# Patient Record
Sex: Female | Born: 1965
Health system: Southern US, Community
[De-identification: ages and names within clinical notes are randomized; demographics above are authoritative.]

## PROBLEM LIST (undated history)

## (undated) DIAGNOSIS — I10 Essential (primary) hypertension: Secondary | ICD-10-CM

## (undated) DIAGNOSIS — F32A Depression, unspecified: Secondary | ICD-10-CM

## (undated) DIAGNOSIS — N950 Postmenopausal bleeding: Secondary | ICD-10-CM

## (undated) DIAGNOSIS — E7800 Pure hypercholesterolemia, unspecified: Secondary | ICD-10-CM

## (undated) DIAGNOSIS — F419 Anxiety disorder, unspecified: Secondary | ICD-10-CM

## (undated) DIAGNOSIS — N840 Polyp of corpus uteri: Secondary | ICD-10-CM

## (undated) DIAGNOSIS — R011 Cardiac murmur, unspecified: Secondary | ICD-10-CM

## (undated) DIAGNOSIS — D259 Leiomyoma of uterus, unspecified: Secondary | ICD-10-CM

## (undated) DIAGNOSIS — E559 Vitamin D deficiency, unspecified: Secondary | ICD-10-CM

## (undated) DIAGNOSIS — K219 Gastro-esophageal reflux disease without esophagitis: Secondary | ICD-10-CM

## (undated) DIAGNOSIS — Z8489 Family history of other specified conditions: Secondary | ICD-10-CM

## (undated) DIAGNOSIS — E039 Hypothyroidism, unspecified: Secondary | ICD-10-CM

## (undated) DIAGNOSIS — E079 Disorder of thyroid, unspecified: Secondary | ICD-10-CM

## (undated) DIAGNOSIS — R9389 Abnormal findings on diagnostic imaging of other specified body structures: Secondary | ICD-10-CM

## (undated) DIAGNOSIS — I059 Rheumatic mitral valve disease, unspecified: Secondary | ICD-10-CM

## (undated) HISTORY — DX: Essential (primary) hypertension: I10

## (undated) HISTORY — DX: Disorder of thyroid, unspecified: E07.9

## (undated) HISTORY — DX: Rheumatic mitral valve disease, unspecified: I05.9

## (undated) HISTORY — DX: Gastro-esophageal reflux disease without esophagitis: K21.9

## (undated) HISTORY — DX: Morbid (severe) obesity due to excess calories: E66.01

## (undated) HISTORY — PX: COLONOSCOPY: SHX174

## (undated) HISTORY — DX: Cardiac murmur, unspecified: R01.1

## (undated) HISTORY — PX: WISDOM TOOTH EXTRACTION: SHX21

## (undated) HISTORY — DX: Pure hypercholesterolemia, unspecified: E78.00

## (undated) HISTORY — PX: ABDOMINAL HYSTERECTOMY: SHX81

---

## 2005-01-17 HISTORY — PX: DILATION AND CURETTAGE OF UTERUS: SHX78

## 2005-03-17 ENCOUNTER — Ambulatory Visit: Payer: Self-pay | Admitting: Unknown Physician Specialty

## 2007-07-26 ENCOUNTER — Ambulatory Visit: Payer: Self-pay | Admitting: General Surgery

## 2008-03-29 ENCOUNTER — Ambulatory Visit: Payer: Self-pay | Admitting: Family Medicine

## 2008-12-01 ENCOUNTER — Ambulatory Visit: Payer: Self-pay | Admitting: Internal Medicine

## 2010-04-12 ENCOUNTER — Ambulatory Visit: Payer: Self-pay | Admitting: Internal Medicine

## 2010-12-21 ENCOUNTER — Ambulatory Visit: Payer: Self-pay | Admitting: Internal Medicine

## 2012-05-07 ENCOUNTER — Encounter: Payer: Self-pay | Admitting: *Deleted

## 2012-05-29 ENCOUNTER — Other Ambulatory Visit: Payer: Self-pay | Admitting: General Surgery

## 2012-05-29 ENCOUNTER — Encounter: Payer: Self-pay | Admitting: General Surgery

## 2012-05-29 ENCOUNTER — Ambulatory Visit (INDEPENDENT_AMBULATORY_CARE_PROVIDER_SITE_OTHER): Payer: 59 | Admitting: General Surgery

## 2012-05-29 VITALS — BP 128/82 | HR 82 | Resp 12 | Ht 65.0 in | Wt 255.0 lb

## 2012-05-29 DIAGNOSIS — Z800 Family history of malignant neoplasm of digestive organs: Secondary | ICD-10-CM

## 2012-05-29 DIAGNOSIS — D126 Benign neoplasm of colon, unspecified: Secondary | ICD-10-CM

## 2012-05-29 MED ORDER — POLYETHYLENE GLYCOL 3350 17 GM/SCOOP PO POWD: ORAL | Status: DC

## 2012-05-29 NOTE — Progress Notes (Signed)
aPatient ID: Sarah Sharp, female   DOB: 03/12/1965, 47 y.o.   MRN: [id number]  Chief Complaint  Patient presents with  . Colonoscopy    HPI Sarah Sharp is a 47 y.o. female who presents for a screening colonoscopy. The patient's last colonoscopy was done in 2009. She has a family history of colon cancer in multiple first degree relatives. She states she is doing well and has no problems with her bowels at this time. All prior colonoscopies have been normal. She has an older sister this year who had a colonoscopy which showed a precancerous polyp. No complaints at this time.  The patient show a 2009 colonoscopy showed moderate diverticulosis in the sigmoid colon. HPI  Past Medical History  Diagnosis Date  . Heart murmur   . Thyroid disease     Past Surgical History  Procedure Laterality Date  . Dilation and curettage of uterus  2007  . Colonoscopy  2002,2009    hx of colon cancer in multiple 1st degree relatives. Findings, multiple small & large mouthed diverticula in sigmoid colon    Family History  Problem Relation Age of Onset  . Cancer Father     colon cancer  . Colon polyps Sister   . Cancer Paternal Grandfather     colon cancer  . Hypertension Mother     Social History History  Substance Use Topics  . Smoking status: Never Smoker   . Smokeless tobacco: Never Used  . Alcohol Use: Yes    No Known Allergies  Current Outpatient Prescriptions  Medication Sig Dispense Refill  . Cholecalciferol (SM VITAMIN D3) 4000 UNITS CAPS Take 2 capsules by mouth daily.      . Cyanocobalamin (VITAMIN B-12) 2000 MCG TBCR Take 1 tablet by mouth 3 (three) times a week.      . fexofenadine (ALLEGRA) 180 MG tablet Take 180 mg by mouth as needed.      Marland Kitchen ibuprofen (ADVIL,MOTRIN) 200 MG tablet Take 200 mg by mouth every 6 (six) hours as needed for pain.      . nortriptyline (PAMELOR) 10 MG capsule Take 10 mg by mouth at bedtime. 2 tabs at [facility]      . polyethylene glycol powder  (GLYCOLAX/MIRALAX) powder 255 grams one bottle for colonoscopy prep  255 g  0  . PROGESTERONE, VAGINAL, 8 % GEL Place vaginally as directed.      . thyroid (ARMOUR) 30 MG tablet Take 30 mg by mouth daily. 5 tabs daily      . vitamin B-12 (CYANOCOBALAMIN) 1000 MCG tablet Take 1,000 mcg by mouth daily.       No current facility-administered medications for this visit.    Review of Systems Review of Systems  Constitutional: Negative.   Respiratory: Negative.   Cardiovascular: Negative.   Gastrointestinal: Negative.     Blood pressure 128/82, pulse 82, resp. rate 12, height 5\' 5"  (1.651 m), weight 255 lb (115.667 kg), last menstrual period 05/21/2012.  Physical Exam Physical Exam  Constitutional: She appears well-developed and well-nourished.  Neck: Trachea normal. No mass and no thyromegaly present.  Cardiovascular: Normal rate, regular rhythm, normal heart sounds and normal pulses.   No murmur heard. Pulmonary/Chest: Effort normal and breath sounds normal.    Data Reviewed None  Assessment    Family history of colon cancer.     Plan    The pros and cons of screening had been reviewed. The risks associated with the procedure including those related to bleeding and perforation of  been discussed.      This patient has been scheduled for a colonoscopy on 06-05-12 at [facility].   Earline Mayotte 05/29/2012, 10:15 PM   a

## 2012-05-29 NOTE — Patient Instructions (Addendum)
Patient to have screening colonoscopy scheduled.   This patient has been scheduled for a colonoscopy on 06-05-12 at [facility].

## 2012-06-04 ENCOUNTER — Telehealth: Payer: Self-pay | Admitting: *Deleted

## 2012-06-04 NOTE — Telephone Encounter (Signed)
Patient was contacted today to reschedule colonoscopy that was scheduled for 06-05-12 due to Dr. Rutherford Nail request. This patient has been rescheduled to 07-03-12. She is aware of all instructions and verbalizes understanding.   Trish in endoscopy notified.

## 2012-06-27 ENCOUNTER — Telehealth: Payer: Self-pay | Admitting: *Deleted

## 2012-06-27 NOTE — Telephone Encounter (Signed)
Patient was contacted today to verify no medication changes since last office visit and she confirms. This patient states she has occasionally taken Emergen-C in the past few weeks and was told this was okay. She reports she has already pre-registered for colonoscopy. We will proceed with colonoscopy that is scheduled for 07-03-12 at [facility]. Patient instructed to call if she has further questions.

## 2012-07-03 ENCOUNTER — Ambulatory Visit: Payer: Self-pay | Admitting: General Surgery

## 2012-07-03 DIAGNOSIS — D128 Benign neoplasm of rectum: Secondary | ICD-10-CM

## 2012-07-03 DIAGNOSIS — D129 Benign neoplasm of anus and anal canal: Secondary | ICD-10-CM

## 2012-07-04 ENCOUNTER — Encounter: Payer: Self-pay | Admitting: General Surgery

## 2012-07-06 ENCOUNTER — Telehealth: Payer: Self-pay | Admitting: General Surgery

## 2012-07-06 NOTE — Telephone Encounter (Signed)
The patient was notified that the polyp removed from the rectum at the time of her 07/04/2012 colonoscopy was benign. (Hyperplastic polyp).  Because of her family history of colon cancer first degree relative she will be encouraged to have a follow up exam in 5 years.

## 2012-07-10 ENCOUNTER — Encounter: Payer: Self-pay | Admitting: General Surgery

## 2013-11-18 ENCOUNTER — Encounter: Payer: Self-pay | Admitting: General Surgery

## 2015-02-09 ENCOUNTER — Ambulatory Visit
Admission: EM | Admit: 2015-02-09 | Discharge: 2015-02-09 | Disposition: A | Discharge status: Home / Self Care | Payer: 59 | Attending: Family Medicine | Admitting: Family Medicine

## 2015-02-09 ENCOUNTER — Encounter: Payer: Self-pay | Admitting: *Deleted

## 2015-02-09 DIAGNOSIS — J069 Acute upper respiratory infection, unspecified: Secondary | ICD-10-CM

## 2015-02-09 HISTORY — DX: Anxiety disorder, unspecified: F41.9

## 2015-02-09 MED ORDER — FLUTICASONE PROPIONATE 50 MCG/ACT NA SUSP: 2.0000 | Freq: Every day | NASAL | Status: DC

## 2015-02-09 MED ORDER — GUAIFENESIN-CODEINE 100-10 MG/5ML PO SOLN: 5.0000 mL | Freq: Three times a day (TID) | ORAL | Status: DC | PRN

## 2015-02-09 NOTE — ED Notes (Signed)
Patient started having symptoms of nasal congestion this past Saturday night. No other symptoms reported.

## 2015-02-09 NOTE — Discharge Instructions (Signed)
Take medication as prescribed. Rest.  Follow-up closely with her primary care physician this week as needed. Return to urgent care as needed for new or worsening concerns.  Upper Respiratory Infection, Adult Most upper respiratory infections (URIs) are a viral infection of the air passages leading to the lungs. A URI affects the nose, throat, and upper air passages. The most common type of URI is nasopharyngitis and is typically referred to as "the common cold." URIs run their course and usually go away on their own. Most of the time, a URI does not require medical attention, but sometimes a bacterial infection in the upper airways can follow a viral infection. This is called a secondary infection. Sinus and middle ear infections are common types of secondary upper respiratory infections. Bacterial pneumonia can also complicate a URI. A URI can worsen asthma and chronic obstructive pulmonary disease (COPD). Sometimes, these complications can require emergency medical care and may be life threatening.  CAUSES Almost all URIs are caused by viruses. A virus is a type of germ and can spread from one person to another.  RISKS FACTORS You may be at risk for a URI if:   You smoke.   You have chronic heart or lung disease.  You have a weakened defense (immune) system.   You are very young or very old.   You have nasal allergies or asthma.  You work in crowded or poorly ventilated areas.  You work in health care facilities or schools. SIGNS AND SYMPTOMS  Symptoms typically develop 2-3 days after you come in contact with a cold virus. Most viral URIs last 7-10 days. However, viral URIs from the influenza virus (flu virus) can last 14-18 days and are typically more severe. Symptoms may include:   Runny or stuffy (congested) nose.   Sneezing.   Cough.   Sore throat.   Headache.   Fatigue.   Fever.   Loss of appetite.   Pain in your forehead, behind your eyes, and over your  cheekbones (sinus pain).  Muscle aches.  DIAGNOSIS  Your health care provider may diagnose a URI by:  Physical exam.  Tests to check that your symptoms are not due to another condition such as:  Strep throat.  Sinusitis.  Pneumonia.  Asthma. TREATMENT  A URI goes away on its own with time. It cannot be cured with medicines, but medicines may be prescribed or recommended to relieve symptoms. Medicines may help:  Reduce your fever.  Reduce your cough.  Relieve nasal congestion. HOME CARE INSTRUCTIONS   Take medicines only as directed by your health care provider.   Gargle warm saltwater or take cough drops to comfort your throat as directed by your health care provider.  Use a warm mist humidifier or inhale steam from a shower to increase air moisture. This may make it easier to breathe.  Drink enough fluid to keep your urine clear or pale yellow.   Eat soups and other clear broths and maintain good nutrition.   Rest as needed.   Return to work when your temperature has returned to normal or as your health care provider advises. You may need to stay home longer to avoid infecting others. You can also use a face mask and careful hand washing to prevent spread of the virus.  Increase the usage of your inhaler if you have asthma.   Do not use any tobacco products, including cigarettes, chewing tobacco, or electronic cigarettes. If you need help quitting, ask your health care  provider. PREVENTION  The best way to protect yourself from getting a cold is to practice good hygiene.   Avoid oral or hand contact with people with cold symptoms.   Wash your hands often if contact occurs.  There is no clear evidence that vitamin C, vitamin E, echinacea, or exercise reduces the chance of developing a cold. However, it is always recommended to get plenty of rest, exercise, and practice good nutrition.  SEEK MEDICAL CARE IF:   You are getting worse rather than better.    Your symptoms are not controlled by medicine.   You have chills.  You have worsening shortness of breath.  You have brown or red mucus.  You have yellow or brown nasal discharge.  You have pain in your face, especially when you bend forward.  You have a fever.  You have swollen neck glands.  You have pain while swallowing.  You have white areas in the back of your throat. SEEK IMMEDIATE MEDICAL CARE IF:   You have severe or persistent:  Headache.  Ear pain.  Sinus pain.  Chest pain.  You have chronic lung disease and any of the following:  Wheezing.  Prolonged cough.  Coughing up blood.  A change in your usual mucus.  You have a stiff neck.  You have changes in your:  Vision.  Hearing.  Thinking.  Mood. MAKE SURE YOU:   Understand these instructions.  Will watch your condition.  Will get help right away if you are not doing well or get worse.   This information is not intended to replace advice given to you by your health care provider. Make sure you discuss any questions you have with your health care provider.   Document Released: 06/29/2000 Document Revised: 05/20/2014 Document Reviewed: 04/10/2013 Elsevier Interactive Patient Education [employer].

## 2015-02-09 NOTE — ED Provider Notes (Signed)
[facility]  ____________________________________________  Time seen: Approximately 3:42 PM  I have reviewed the triage vital signs and the nursing notes.   HISTORY  Chief Complaint Nasal Congestion   HPI Sarah Sharp is a 50 y.o. female    Presents for the complaints of 2 days of runny nose, nasal congestion and sinus pressure. Reports occasional cough. Reports clear drainage. Denies fevers. Denies chest pain, shortness of breath, abdominal pain, dizziness or weakness. Denies known sick contacts. Reports continues to eat and drink well. Reports over-the-counter cough and congestion medications  did not seem to be helping. Reports history of seasonal allergies.  Last menstrual : Current. Denies chance of pregnancy.   PCP: [facility]   Past Medical History  Diagnosis Date  . Heart murmur   . Thyroid disease   . Anxiety     Patient Active Problem List   Diagnosis Date Noted  . Family history of colon cancer 05/29/2012    Past Surgical History  Procedure Laterality Date  . Dilation and curettage of uterus  2007  . Colonoscopy  2002,2009    hx of colon cancer in multiple 1st degree relatives. Findings, multiple small & large mouthed diverticula in sigmoid colon    Current Outpatient Rx  Name  Route  Sig  Dispense  Refill  . Cholecalciferol (SM VITAMIN D3) 4000 UNITS CAPS   Oral   Take 2 capsules by mouth daily.         . fexofenadine (ALLEGRA) 180 MG tablet   Oral   Take 180 mg by mouth as needed.         Marland Kitchen ibuprofen (ADVIL,MOTRIN) 200 MG tablet   Oral   Take 200 mg by mouth every 6 (six) hours as needed for pain.         Marland Kitchen levothyroxine (SYNTHROID, LEVOTHROID) 150 MCG tablet   Oral   Take 150 mcg by mouth daily before breakfast.         . nortriptyline (PAMELOR) 10 MG capsule   Oral   Take 20 mg by mouth at bedtime.         Marland Kitchen PROGESTERONE, VAGINAL, 8 % GEL   Vaginal   Place vaginally as directed.         . vitamin B-12 (CYANOCOBALAMIN)  1000 MCG tablet   Oral   Take 1,000 mcg by mouth daily.         . Cyanocobalamin (VITAMIN B-12) 2000 MCG TBCR   Oral   Take 1 tablet by mouth 3 (three) times a week.         . nortriptyline (PAMELOR) 10 MG capsule   Oral   Take 10 mg by mouth at bedtime. 2 tabs at [facility]         . polyethylene glycol powder (GLYCOLAX/MIRALAX) powder      255 grams one bottle for colonoscopy prep   255 g   0   . thyroid (ARMOUR) 30 MG tablet   Oral   Take 30 mg by mouth daily. 5 tabs daily           Allergies Review of patient's allergies indicates no known allergies.  Family History  Problem Relation Age of Onset  . Cancer Father     colon cancer  . Colon polyps Sister   . Cancer Paternal Grandfather     colon cancer  . Hypertension Mother     Social History Social History  Substance Use Topics  . Smoking status: Never Smoker   .  Smokeless tobacco: Never Used  . Alcohol Use: No    Review of Systems Constitutional: No fever/chills Eyes: No visual changes. ENT: No sore throat. Positive runny nose, nasal congestion, and sinus pressure. Cardiovascular: Denies chest pain. Respiratory: Denies shortness of breath. Gastrointestinal: No abdominal pain.  No nausea, no vomiting.  No diarrhea.  No constipation. Genitourinary: Negative for dysuria. Musculoskeletal: Negative for back pain. Skin: Negative for rash. Neurological: Negative for headaches, focal weakness or numbness.  10-point ROS otherwise negative.  ____________________________________________   PHYSICAL EXAM:  VITAL SIGNS: ED Triage Vitals  Enc Vitals Group     BP 02/09/15 1508 156/93 mmHg     Pulse Rate 02/09/15 1454 105 Recheck 92     Resp 02/09/15 1454 18     Temp 02/09/15 1454 98.4 F (36.9 C)     Temp Source 02/09/15 1454 Oral     SpO2 02/09/15 1454 100 %     Weight 02/09/15 1454 270 lb (122.471 kg)     Height 02/09/15 1454 5\' 5"  (1.651 m)     Head Cir --      Peak Flow --      Pain Score  02/09/15 1504 0     Pain Loc --      Pain Edu? --      Excl. in [place]? --     Constitutional: Alert and oriented. Well appearing and in no acute distress. Eyes: Conjunctivae are normal. PERRL. EOMI. Head: [email or web address] tenderness to palpation right maxillary sinus. No other sinus tenderness to palpation. No swelling. No erythema.   Ears: no erythema, normal TMs bilaterally.   Nose: nasal congestion with bilateral nasal turbinate erythema. Clear rhinorrhea. Throat: Mucous membranes are moist.  Oropharynx non-erythematous. No tonsillar swelling or exudate.  Neck: No stridor.  No cervical spine tenderness to palpation. Hematological/Lymphatic/Immunilogical: No cervical lymphadenopathy. Cardiovascular: Normal rate, regular rhythm. Grossly normal heart sounds.  Good peripheral circulation. Respiratory: Normal respiratory effort.  No retractions. Lungs CTAB. No wheezes, rales or rhonchi. Good air movement.  Gastrointestinal: Soft and nontender.Normal Bowel sounds. No CVA tenderness. Musculoskeletal: No lower or upper extremity tenderness nor edema.  No joint effusions. Bilateral pedal pulses equal and easily palpated.  Neurologic:  Normal speech and language. No gross focal neurologic deficits are appreciated. No gait instability. Skin:  Skin is warm, dry and intact. No rash noted. Psychiatric: Mood and affect are normal. Speech and behavior are normal.  ____________________________________________   LABS (all labs ordered are listed, but only abnormal results are displayed)  Labs Reviewed - No data to display   INITIAL IMPRESSION / ASSESSMENT AND PLAN / ED COURSE  Pertinent labs & imaging results that were available during my care of the patient were reviewed by me and considered in my medical decision making (see chart for details).  very well-appearing patient. No acute distress. Presents with complaints of 2 days of runny nose, nasal congestion, sinus drainage and sinus pressure.  Reports occasional cough. Denies fevers. Denies chest pain, shortness of breath, purulent drainage, weakness or decreased by mouth intake. Lungs clear throughout. Abdomen soft and nontender. Suspect viral upper respiratory infection. Will treat symptomatically and supportively. Will treat with when necessary guaifenesin with codeine, Flonase, over-the-counter Claritin-D. Encouraged rest, fluids, saline nasal rinses or Nettie pot use. Work note given for today.  cussed follow up with Primary care physician this week. Discussed follow up and return parameters including no resolution or any worsening concerns. Patient verbalized understanding and agreed to plan.   ____________________________________________  FINAL CLINICAL IMPRESSION(S) / ED DIAGNOSES  Final diagnoses:  Upper respiratory infection      Note: This dictation was prepared with Dragon dictation along with smaller phrase technology. Any transcriptional errors that result from this process are unintentional.    Marylene Land, NP 02/09/15 1644

## 2015-12-01 ENCOUNTER — Other Ambulatory Visit: Payer: Self-pay | Admitting: Certified Nurse Midwife

## 2015-12-01 DIAGNOSIS — Z1231 Encounter for screening mammogram for malignant neoplasm of breast: Secondary | ICD-10-CM

## 2015-12-03 ENCOUNTER — Ambulatory Visit
Admission: RE | Admit: 2015-12-03 | Discharge: 2015-12-03 | Disposition: A | Discharge status: Home / Self Care | Payer: 59 | Source: Ambulatory Visit | Attending: Certified Nurse Midwife | Admitting: Certified Nurse Midwife

## 2015-12-03 DIAGNOSIS — Z1231 Encounter for screening mammogram for malignant neoplasm of breast: Secondary | ICD-10-CM | POA: Diagnosis not present

## 2016-02-29 DIAGNOSIS — E039 Hypothyroidism, unspecified: Secondary | ICD-10-CM | POA: Diagnosis not present

## 2016-02-29 DIAGNOSIS — E559 Vitamin D deficiency, unspecified: Secondary | ICD-10-CM | POA: Diagnosis not present

## 2016-03-07 DIAGNOSIS — E039 Hypothyroidism, unspecified: Secondary | ICD-10-CM | POA: Diagnosis not present

## 2016-03-07 DIAGNOSIS — I10 Essential (primary) hypertension: Secondary | ICD-10-CM | POA: Diagnosis not present

## 2016-04-27 DIAGNOSIS — I10 Essential (primary) hypertension: Secondary | ICD-10-CM | POA: Diagnosis not present

## 2016-08-10 DIAGNOSIS — E039 Hypothyroidism, unspecified: Secondary | ICD-10-CM | POA: Diagnosis not present

## 2016-08-10 DIAGNOSIS — I10 Essential (primary) hypertension: Secondary | ICD-10-CM | POA: Diagnosis not present

## 2016-10-28 DIAGNOSIS — I10 Essential (primary) hypertension: Secondary | ICD-10-CM | POA: Diagnosis not present

## 2016-11-03 DIAGNOSIS — I10 Essential (primary) hypertension: Secondary | ICD-10-CM | POA: Diagnosis not present

## 2016-11-03 DIAGNOSIS — E039 Hypothyroidism, unspecified: Secondary | ICD-10-CM | POA: Diagnosis not present

## 2016-12-05 ENCOUNTER — Other Ambulatory Visit: Payer: Self-pay | Admitting: Certified Nurse Midwife

## 2016-12-05 ENCOUNTER — Ambulatory Visit (INDEPENDENT_AMBULATORY_CARE_PROVIDER_SITE_OTHER): Payer: 59 | Admitting: Certified Nurse Midwife

## 2016-12-05 ENCOUNTER — Encounter: Payer: Self-pay | Admitting: Certified Nurse Midwife

## 2016-12-05 VITALS — BP 142/78 | HR 83 | Ht 65.0 in | Wt 271.0 lb

## 2016-12-05 DIAGNOSIS — Z800 Family history of malignant neoplasm of digestive organs: Secondary | ICD-10-CM

## 2016-12-05 DIAGNOSIS — Z01419 Encounter for gynecological examination (general) (routine) without abnormal findings: Secondary | ICD-10-CM

## 2016-12-05 DIAGNOSIS — I10 Essential (primary) hypertension: Secondary | ICD-10-CM | POA: Insufficient documentation

## 2016-12-05 DIAGNOSIS — Z8041 Family history of malignant neoplasm of ovary: Secondary | ICD-10-CM

## 2016-12-05 DIAGNOSIS — F329 Major depressive disorder, single episode, unspecified: Secondary | ICD-10-CM | POA: Insufficient documentation

## 2016-12-05 DIAGNOSIS — Z1231 Encounter for screening mammogram for malignant neoplasm of breast: Secondary | ICD-10-CM | POA: Diagnosis not present

## 2016-12-05 DIAGNOSIS — Z124 Encounter for screening for malignant neoplasm of cervix: Secondary | ICD-10-CM

## 2016-12-05 DIAGNOSIS — F419 Anxiety disorder, unspecified: Secondary | ICD-10-CM | POA: Insufficient documentation

## 2016-12-05 DIAGNOSIS — E039 Hypothyroidism, unspecified: Secondary | ICD-10-CM | POA: Insufficient documentation

## 2016-12-05 DIAGNOSIS — F32A Depression, unspecified: Secondary | ICD-10-CM | POA: Insufficient documentation

## 2016-12-05 DIAGNOSIS — Z1239 Encounter for other screening for malignant neoplasm of breast: Secondary | ICD-10-CM

## 2016-12-05 MED ORDER — PROGESTERONE MICRONIZED 200 MG PO CAPS: ORAL_CAPSULE | ORAL | 4 refills | Status: DC

## 2016-12-05 NOTE — Progress Notes (Signed)
Gynecology Annual Exam  PCP: Glendon Axe, MD  Chief Complaint:  Chief Complaint  Patient presents with  . Gynecologic Exam    History of Present Illness:Sarah Sharp is a 51 year old Caucasian/White female , G 0 , who presents for her annual exam . She is having monthly bleeding since her last annual exam . She currently takes progesterone 200 mgm from days 14-25 each cycle. She did not take Prometrium in January and had a bleed x 11 days, although not heavy. Ultrasound 11/07/2014 revealed an EM stripe measuring 5.14 mm. There were two fibroids: a posterior intramural fibroid measuring 50x39x41 mm and another anterior fibroid measuring 14x8x11 mm. There was a small paraovarian cyst on the right and a simple cyst on the left ovary 40x36x66mm in size.  Her menses are usually regular They occur every month , they usually last 3-7 days , are medium flow with an occasional heavy flow day. Her LMP was 11/17/2016.  Denies hot flashes   She reports dysmenorrhea. She uses ibuprofen 600 mgm with symptomatic relief.  The patient's past medical history is notable for a history of hypothyroidism, anxiety, hyperlipidemia, hypertension, and obesity.  Since her last annual GYN exam dated 12/01/2015, she has been restarted on antihypertensive medication.  Her PCP is Glendon Axe  She is not sexually active.  Her most recent pap smear was obtained 11/05/2013 and was with negative cells and negative HPV DNA.  Her most recent mammogram obtained on 12/03/2015 was normal.  There is no family history of breast cancer.  There is a family history of ovarian cancer in her maternal grandmother. Genetic testing has not been done. Her father and paternal grandmother have colon cancer. Her last colonoscopy was in 2014. Next one is due 2019. The patient does not do monthly self breast exams.  The patient does not smoke.  The patient does drink rarely.  The patient does not use illegal drugs.  The patient  does not exercise.  The patient does get adequate calcium in her diet.  She had a recent cholesterol screen in 2018 by PCP that was normal except for mildly elevated  triglyceride level.    The patient denies current symptoms of depression.    Review of Systems: ROS  Past Medical History:  Past Medical History:  Diagnosis Date  . Anxiety   . Heart murmur   . Thyroid disease     Past Surgical History:  Past Surgical History:  Procedure Laterality Date  . COLONOSCOPY  2002,2009   hx of colon cancer in multiple 1st degree relatives. Findings, multiple small & large mouthed diverticula in sigmoid colon  . DILATION AND CURETTAGE OF UTERUS  2007    Family History:  Family History  Problem Relation Age of Onset  . Cancer Father        colon cancer  . Colon polyps Sister   . Cancer Paternal Grandfather        colon cancer  . Hypertension Mother   . Breast cancer Neg Hx     Social History:  Social History   Socioeconomic History  . Marital status: Single    Spouse name: Not on file  . Number of children: Not on file  . Years of education: Not on file  . Highest education level: Not on file  Social Needs  . Financial resource strain: Not on file  . Food insecurity - worry: Not on file  . Food insecurity - inability: Not on  file  . Transportation needs - medical: Not on file  . Transportation needs - non-medical: Not on file  Occupational History  . Not on file  Tobacco Use  . Smoking status: Never Smoker  . Smokeless tobacco: Never Used  Substance and Sexual Activity  . Alcohol use: No  . Drug use: No  . Sexual activity: Not on file  Other Topics Concern  . Not on file  Social History Narrative  . Not on file    Allergies:  No Known Allergies  Medications: Prior to Admission medications   Medication Sig Start Date End Date Taking? Authorizing Provider  Cholecalciferol (SM VITAMIN D3) 4000 UNITS CAPS Take 2 capsules by mouth daily.    [provider]  Cyanocobalamin (VITAMIN B-12) 2000 MCG TBCR Take 1 tablet by mouth 3 (three) times a week.    [provider]  fexofenadine (ALLEGRA) 180 MG tablet Take 180 mg by mouth as needed.    [provider]  fluticasone (FLONASE) 50 MCG/ACT nasal spray Place 2 sprays into both nostrils daily. 02/09/15 02/23/15  Marylene Land, NP  guaiFENesin-codeine 100-10 MG/5ML syrup Take 5 mLs by mouth 3 (three) times daily as needed for cough. 02/09/15   Marylene Land, NP  ibuprofen (ADVIL,MOTRIN) 200 MG tablet Take 200 mg by mouth every 6 (six) hours as needed for pain.    [provider]  levothyroxine (SYNTHROID, LEVOTHROID) 150 MCG tablet Take 150 mcg by mouth daily before breakfast.    [provider]  nortriptyline (PAMELOR) 10 MG capsule Take 10 mg by mouth at bedtime. 2 tabs at [facility] 03/05/12   [provider]  nortriptyline (PAMELOR) 10 MG capsule Take 20 mg by mouth at bedtime.    [provider]  polyethylene glycol powder (GLYCOLAX/MIRALAX) powder 255 grams one bottle for colonoscopy prep 05/29/12   Robert Bellow, MD  PROGESTERONE, VAGINAL, 8 % GEL Place vaginally as directed.    [provider]  thyroid (ARMOUR) 30 MG tablet Take 30 mg by mouth daily. 5 tabs daily    [provider]  vitamin B-12 (CYANOCOBALAMIN) 1000 MCG tablet Take 1,000 mcg by mouth daily.    [provider]    Physical Exam Vitals: BP (!) 142/78   Pulse 83   Ht 5\' 5"  (1.651 m)   Wt 271 lb (122.9 kg)   LMP 11/17/2016 (Exact Date)   BMI 45.10 kg/m   General: Obese WF in NAD HEENT: normocephalic, anicteric Neck: no thyroid enlargement, no palpable nodules, no cervical lymphadenopathy  Pulmonary: No increased work of breathing, CTAB Cardiovascular: RRR, without murmur  Breast: Breast symmetrical, no tenderness, no palpable nodules or masses, no skin or nipple retraction present, no nipple discharge.  No axillary, infraclavicular or  supraclavicular lymphadenopathy. Abdomen: Soft, non-tender, obese, non-distended.  Umbilicus without lesions.  No hepatomegaly or masses palpable. No evidence of hernia. Genitourinary:  External: Normal external female genitalia.  Normal urethral meatus, normal Bartholin's and Skene's glands.    Vagina: Normal vaginal mucosa, no evidence of prolapse.    Cervix: Grossly normal in appearance, no bleeding, non-tender  Uterus: Anteverted, normal size, shape, and consistency, mobile, and non-tender  Adnexa: No adnexal masses, non-tender  Rectal: deferred  Lymphatic: no evidence of inguinal lymphadenopathy Extremities: no edema, erythema, or tenderness Neurologic: Grossly intact Psychiatric: mood appropriate, affect full     Assessment: 51 y.o. G0P0 annual gyn exam History of DUB, currently taking Progestin cyclically Family history of colon and either ovarian or  uterine cancer    Plan:   1) Breast cancer screening - recommend monthly self breast exam and annual screening mammograms. Mammogram was ordered today. Patient to call [place] for appointment  2) Offered MYRISK testing-patient considering, but declines at this time  3) Cervical cancer screening - Pap was done. ASCCP guidelines and rational discussed.  Patient opts for every 3 years screening interval  4) DUB: Continue cycling with Prometrium 200 mgm days 14-25 of each cycle  5) Routine healthcare maintenance including cholesterol and diabetes screening managed by PCP. Discussed the role of exercise in helping to prevent osteoporosis and in helping with weight loss and keeping muscle mass.   Dalia Heading, CNM

## 2016-12-05 NOTE — Patient Instructions (Signed)
Continue Prometrium from days 14-25 each cycle.

## 2016-12-07 ENCOUNTER — Ambulatory Visit
Admission: RE | Admit: 2016-12-07 | Discharge: 2016-12-07 | Disposition: A | Discharge status: Home / Self Care | Payer: 59 | Source: Ambulatory Visit | Attending: Certified Nurse Midwife | Admitting: Certified Nurse Midwife

## 2016-12-07 DIAGNOSIS — Z1231 Encounter for screening mammogram for malignant neoplasm of breast: Secondary | ICD-10-CM | POA: Insufficient documentation

## 2016-12-07 LAB — IGP, APTIMA HPV
HPV Aptima: NEGATIVE
PAP Smear Comment: 0

## 2017-02-08 DIAGNOSIS — I10 Essential (primary) hypertension: Secondary | ICD-10-CM | POA: Diagnosis not present

## 2017-02-08 DIAGNOSIS — R739 Hyperglycemia, unspecified: Secondary | ICD-10-CM | POA: Diagnosis not present

## 2017-02-15 DIAGNOSIS — R300 Dysuria: Secondary | ICD-10-CM | POA: Diagnosis not present

## 2017-02-15 DIAGNOSIS — N390 Urinary tract infection, site not specified: Secondary | ICD-10-CM | POA: Diagnosis not present

## 2017-03-06 DIAGNOSIS — Z0000 Encounter for general adult medical examination without abnormal findings: Secondary | ICD-10-CM | POA: Diagnosis not present

## 2017-03-06 DIAGNOSIS — Z23 Encounter for immunization: Secondary | ICD-10-CM | POA: Diagnosis not present

## 2017-07-11 ENCOUNTER — Ambulatory Visit: Payer: 59 | Admitting: General Surgery

## 2017-07-11 ENCOUNTER — Encounter: Payer: Self-pay | Admitting: General Surgery

## 2017-07-11 VITALS — BP 146/80 | HR 88 | Resp 12 | Ht 65.0 in | Wt 276.0 lb

## 2017-07-11 DIAGNOSIS — Z800 Family history of malignant neoplasm of digestive organs: Secondary | ICD-10-CM | POA: Diagnosis not present

## 2017-07-11 DIAGNOSIS — Z86010 Personal history of colonic polyps: Secondary | ICD-10-CM | POA: Diagnosis not present

## 2017-07-11 MED ORDER — POLYETHYLENE GLYCOL 3350 17 GM/SCOOP PO POWD: 1.0000 | Freq: Once | ORAL | 0 refills | Status: AC

## 2017-07-11 NOTE — Progress Notes (Signed)
Patient ID: Sarah Sharp, female   DOB: Jun 12, 1965, 52 y.o.   MRN: [id number]  Chief Complaint  Patient presents with  . Colonoscopy    HPI COURTNY BENNISON is a 52 y.o. female Here today for a evaluation of a colonoscopy. Last colonoscopy was 07/03/2012. Patient states she moves her bowels daily, no bleeding. Denies any heart burn or indigestion.  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  HPI  Past Medical History:  Diagnosis Date  . Anxiety   . Heart murmur   . Hypercholesterolemia   . Hypertension   . Mitral valve disorder   . Morbid obesity ([place])   . Thyroid disease     Past Surgical History:  Procedure Laterality Date  . COLONOSCOPY  2002,2009, 2014   hx of colon cancer in multiple 1st degree relatives. Findings, multiple small & large mouthed diverticula in sigmoid colon  . DILATION AND CURETTAGE OF UTERUS  2007   endometrial polyps  . WISDOM TOOTH EXTRACTION      Family History  Problem Relation Age of Onset  . Cancer Father 66       colon cancer  . Colon polyps Sister   . Anxiety disorder Sister   . Hypertension Mother   . Depression Mother   . Bladder Cancer Maternal Grandmother 93  . Ovarian cancer Maternal Grandmother        vs uterine cancer had hysterectomy  . Hypertension Maternal Grandfather   . Cancer Paternal Grandmother        colon  . Thyroid disease Paternal Grandmother   . Bipolar disorder Sister   . Colon cancer Other   . Breast cancer Neg Hx     Social History Social History   Tobacco Use  . Smoking status: Never Smoker  . Smokeless tobacco: Never Used  Substance Use Topics  . Alcohol use: Yes    Comment: rarely  . Drug use: No    No Known Allergies  Current Outpatient Medications  Medication Sig Dispense Refill  . amLODipine (NORVASC) 10 MG tablet TAKE 1 TABLET BY MOUTH ONCE DAILY    . Cholecalciferol (SM VITAMIN D3) 4000 UNITS CAPS Take 2 capsules by mouth daily.    . fexofenadine (ALLEGRA) 180 MG tablet Take 180 mg by mouth  as needed.    Marland Kitchen ibuprofen (ADVIL,MOTRIN) 200 MG tablet Take 200 mg by mouth every 6 (six) hours as needed for pain.    Marland Kitchen levothyroxine (SYNTHROID, LEVOTHROID) 125 MCG tablet TAKE 1 TABLET BY MOUTH ONCE DAILY ON AN EMPTY STOMACH  WITH A GLASS OF WATER 30-60 MINUTES BEFORE BREAKFAST    . lisinopril-hydrochlorothiazide (PRINZIDE,ZESTORETIC) 10-12.5 MG tablet Take by mouth.    . Multiple Vitamins-Minerals (MULTIVITAMIN ADULT PO) Take by mouth.    . nortriptyline (PAMELOR) 10 MG capsule TAKE 2 CAPSULES BY MOUTH  NIGHTLY    . progesterone (PROMETRIUM) 200 MG capsule Take one tablet daily from day 14-25 of cycle 36 capsule 4  . polyethylene glycol powder (GLYCOLAX/MIRALAX) powder Take 255 g by mouth once for 1 dose. Mix whole container with 64 ounces of clear liquids 255 g 0   No current facility-administered medications for this visit.     Review of Systems Review of Systems  Constitutional: Negative.   Respiratory: Negative.   Cardiovascular: Negative.   Gastrointestinal: Negative for constipation and diarrhea.    Blood pressure (!) 146/80, pulse 88, resp. rate 12, height 5\' 5"  (1.651 m), weight 276 lb (125.2 kg), last menstrual period 06/22/2017,  SpO2 98 %.  Physical Exam Physical Exam  Constitutional: She is oriented to person, place, and time. She appears well-developed and well-nourished.  HENT:  Mouth/Throat: Oropharynx is clear and moist. No oropharyngeal exudate.  Eyes: Conjunctivae are normal. No scleral icterus.  Neck: Neck supple.  Cardiovascular: Normal rate, regular rhythm and normal heart sounds.  Pulmonary/Chest: Effort normal and breath sounds normal.  Lymphadenopathy:    She has no cervical adenopathy.  Neurological: She is alert and oriented to person, place, and time.  Skin: Skin is warm and dry.  Psychiatric: Her behavior is normal.     Data Reviewed Colonoscopy completed July 04, 2012 showed a hyperplastic polyp in the rectum.   Assessment    Family history  colon cancer (father at age 33).    Plan  Discussed Cologuard as second line screening.  Colonoscopy with possible biopsy/polypectomy prn: Information regarding the procedure, including its potential risks and complications (including but not limited to perforation of the bowel, which may require emergency surgery to repair, and bleeding) was verbally given to the patient. Educational information regarding lower intestinal endoscopy was given to the patient. Written instructions for how to complete the bowel prep using Miralax were provided. The importance of drinking ample fluids to avoid dehydration as a result of the prep emphasized.   HPI, Physical Exam, Assessment and Plan have been scribed under the direction and in the presence of Robert Bellow, MD. Karie Fetch, RN  I have completed the exam and reviewed the above documentation for accuracy and completeness.  I agree with the above.  Haematologist has been used and any errors in dictation or transcription are unintentional.  Hervey Ard, M.D., F.A.C.S.   The patient is scheduled for a Colonoscopy at [facility] on 07/19/17. They are aware to call the day before to get their arrival time. Miralax prescription has been sent into the patient's pharmacy. The patient is aware of date and instructions.  Documented by Caryl-Lyn Otis Brace LPN   Forest Gleason Meghana Tullo 07/11/2017, 9:17 PM

## 2017-07-11 NOTE — Patient Instructions (Addendum)
Colonoscopy, Adult A colonoscopy is an exam to look at the entire large intestine. During the exam, a lubricated, bendable tube is inserted into the anus and then passed into the rectum, colon, and other parts of the large intestine. A colonoscopy is often done as a part of normal colorectal screening or in response to certain symptoms, such as anemia, persistent diarrhea, abdominal pain, and blood in the stool. The exam can help screen for and diagnose medical problems, including:  Tumors.  Polyps.  Inflammation.  Areas of bleeding.  Tell a health care provider about:  Any allergies you have.  All medicines you are taking, including vitamins, herbs, eye drops, creams, and over-the-counter medicines.  Any problems you or family members have had with anesthetic medicines.  Any blood disorders you have.  Any surgeries you have had.  Any medical conditions you have.  Any problems you have had passing stool. What are the risks? Generally, this is a safe procedure. However, problems may occur, including:  Bleeding.  A tear in the intestine.  A reaction to medicines given during the exam.  Infection (rare).  What happens before the procedure? Eating and drinking restrictions Follow instructions from your health care provider about eating and drinking, which may include:  A few days before the procedure - follow a low-fiber diet. Avoid nuts, seeds, dried fruit, raw fruits, and vegetables.  1-3 days before the procedure - follow a clear liquid diet. Drink only clear liquids, such as clear broth or bouillon, black coffee or tea, clear juice, clear soft drinks or sports drinks, gelatin dessert, and popsicles. Avoid any liquids that contain red or purple dye.  On the day of the procedure - do not eat or drink anything during the 2 hours before the procedure, or within the time period that your health care provider recommends.  Bowel prep If you were prescribed an oral bowel prep  to clean out your colon:  Take it as told by your health care provider. Starting the day before your procedure, you will need to drink a large amount of medicated liquid. The liquid will cause you to have multiple loose stools until your stool is almost clear or light green.  If your skin or anus gets irritated from diarrhea, you may use these to relieve the irritation: ? Medicated wipes, such as adult wet wipes with aloe and vitamin E. ? A skin soothing-product like petroleum jelly.  If you vomit while drinking the bowel prep, take a break for up to 60 minutes and then begin the bowel prep again. If vomiting continues and you cannot take the bowel prep without vomiting, call your health care provider.  General instructions  Ask your health care provider about changing or stopping your regular medicines. This is especially important if you are taking diabetes medicines or blood thinners.  Plan to have someone take you home from the hospital or clinic. What happens during the procedure?  An IV tube may be inserted into one of your veins.  You will be given medicine to help you relax (sedative).  To reduce your risk of infection: ? Your health care team will wash or sanitize their hands. ? Your anal area will be washed with soap.  You will be asked to lie on your side with your knees bent.  Your health care provider will lubricate a long, thin, flexible tube. The tube will have a camera and a light on the end.  The tube will be inserted into your   anus.  The tube will be gently eased through your rectum and colon.  Air will be delivered into your colon to keep it open. You may feel some pressure or cramping.  The camera will be used to take images during the procedure.  A small tissue sample may be removed from your body to be examined under a microscope (biopsy). If any potential problems are found, the tissue will be sent to a lab for testing.  If small polyps are found, your  health care provider may remove them and have them checked for cancer cells.  The tube that was inserted into your anus will be slowly removed. The procedure may vary among health care providers and hospitals. What happens after the procedure?  Your blood pressure, heart rate, breathing rate, and blood oxygen level will be monitored until the medicines you were given have worn off.  Do not drive for 24 hours after the exam.  You may have a small amount of blood in your stool.  You may pass gas and have mild abdominal cramping or bloating due to the air that was used to inflate your colon during the exam.  It is up to you to get the results of your procedure. Ask your health care provider, or the department performing the procedure, when your results will be ready. This information is not intended to replace advice given to you by your health care provider. Make sure you discuss any questions you have with your health care provider. Document Released: 01/01/2000 Document Revised: 11/04/2015 Document Reviewed: 03/17/2015 Elsevier Interactive Patient Education  [employer].  The patient is scheduled for a Colonoscopy at [facility] on 07/19/17. They are aware to call the day before to get their arrival time. Miralax prescription has been sent into the patient's pharmacy. The patient is aware of date and instructions.

## 2017-07-19 ENCOUNTER — Encounter
Admission: RE | Disposition: A | Discharge status: Home / Self Care | Payer: Self-pay | Source: Ambulatory Visit | Attending: General Surgery

## 2017-07-19 ENCOUNTER — Encounter: Payer: Self-pay | Admitting: *Deleted

## 2017-07-19 ENCOUNTER — Other Ambulatory Visit: Payer: Self-pay

## 2017-07-19 ENCOUNTER — Ambulatory Visit: Payer: 59 | Admitting: Registered Nurse

## 2017-07-19 ENCOUNTER — Ambulatory Visit
Admission: RE | Admit: 2017-07-19 | Discharge: 2017-07-19 | Disposition: A | Discharge status: Home / Self Care | Payer: 59 | Source: Ambulatory Visit | Attending: General Surgery | Admitting: General Surgery

## 2017-07-19 DIAGNOSIS — Z79899 Other long term (current) drug therapy: Secondary | ICD-10-CM | POA: Insufficient documentation

## 2017-07-19 DIAGNOSIS — Z1211 Encounter for screening for malignant neoplasm of colon: Secondary | ICD-10-CM | POA: Insufficient documentation

## 2017-07-19 DIAGNOSIS — Z86010 Personal history of colonic polyps: Secondary | ICD-10-CM | POA: Diagnosis not present

## 2017-07-19 DIAGNOSIS — D123 Benign neoplasm of transverse colon: Secondary | ICD-10-CM | POA: Diagnosis not present

## 2017-07-19 DIAGNOSIS — D126 Benign neoplasm of colon, unspecified: Secondary | ICD-10-CM | POA: Diagnosis not present

## 2017-07-19 DIAGNOSIS — Z6841 Body Mass Index (BMI) 40.0 and over, adult: Secondary | ICD-10-CM | POA: Insufficient documentation

## 2017-07-19 DIAGNOSIS — I059 Rheumatic mitral valve disease, unspecified: Secondary | ICD-10-CM | POA: Insufficient documentation

## 2017-07-19 DIAGNOSIS — Z800 Family history of malignant neoplasm of digestive organs: Secondary | ICD-10-CM

## 2017-07-19 DIAGNOSIS — R011 Cardiac murmur, unspecified: Secondary | ICD-10-CM | POA: Diagnosis not present

## 2017-07-19 DIAGNOSIS — D125 Benign neoplasm of sigmoid colon: Secondary | ICD-10-CM | POA: Insufficient documentation

## 2017-07-19 DIAGNOSIS — I10 Essential (primary) hypertension: Secondary | ICD-10-CM | POA: Diagnosis not present

## 2017-07-19 DIAGNOSIS — F419 Anxiety disorder, unspecified: Secondary | ICD-10-CM | POA: Diagnosis not present

## 2017-07-19 DIAGNOSIS — E7800 Pure hypercholesterolemia, unspecified: Secondary | ICD-10-CM | POA: Insufficient documentation

## 2017-07-19 DIAGNOSIS — E039 Hypothyroidism, unspecified: Secondary | ICD-10-CM | POA: Diagnosis not present

## 2017-07-19 DIAGNOSIS — Z8371 Family history of colonic polyps: Secondary | ICD-10-CM | POA: Diagnosis not present

## 2017-07-19 DIAGNOSIS — K5730 Diverticulosis of large intestine without perforation or abscess without bleeding: Secondary | ICD-10-CM | POA: Insufficient documentation

## 2017-07-19 DIAGNOSIS — K5790 Diverticulosis of intestine, part unspecified, without perforation or abscess without bleeding: Secondary | ICD-10-CM | POA: Diagnosis not present

## 2017-07-19 DIAGNOSIS — K635 Polyp of colon: Secondary | ICD-10-CM | POA: Diagnosis not present

## 2017-07-19 HISTORY — PX: COLONOSCOPY WITH PROPOFOL: SHX5780

## 2017-07-19 HISTORY — DX: Hypothyroidism, unspecified: E03.9

## 2017-07-19 SURGERY — COLONOSCOPY WITH PROPOFOL
Anesthesia: General

## 2017-07-19 MED ORDER — MIDAZOLAM HCL 2 MG/2ML IJ SOLN
INTRAMUSCULAR | Status: AC
  Filled 2017-07-19: qty 2

## 2017-07-19 MED ORDER — PROPOFOL 500 MG/50ML IV EMUL
INTRAVENOUS | Status: AC
  Filled 2017-07-19: qty 50

## 2017-07-19 MED ORDER — PROPOFOL 10 MG/ML IV BOLUS
INTRAVENOUS | Status: DC | PRN
  Administered 2017-07-19: 70 mg via INTRAVENOUS
  Administered 2017-07-19 (×2): 10 mg via INTRAVENOUS

## 2017-07-19 MED ORDER — PROPOFOL 10 MG/ML IV BOLUS
INTRAVENOUS | Status: AC
  Filled 2017-07-19: qty 20

## 2017-07-19 MED ORDER — PROPOFOL 500 MG/50ML IV EMUL
INTRAVENOUS | Status: DC | PRN
  Administered 2017-07-19: 150 ug/kg/min via INTRAVENOUS

## 2017-07-19 MED ORDER — SODIUM CHLORIDE 0.9 % IV SOLN
INTRAVENOUS | Status: DC
  Administered 2017-07-19: 10:00:00 via INTRAVENOUS

## 2017-07-19 MED ORDER — LIDOCAINE HCL (CARDIAC) PF 100 MG/5ML IV SOSY
PREFILLED_SYRINGE | INTRAVENOUS | Status: DC | PRN
  Administered 2017-07-19: 40 mg via INTRAVENOUS

## 2017-07-19 MED ORDER — MIDAZOLAM HCL 2 MG/2ML IJ SOLN
INTRAMUSCULAR | Status: DC | PRN
  Administered 2017-07-19: 2 mg via INTRAVENOUS

## 2017-07-19 NOTE — Anesthesia Post-op Follow-up Note (Signed)
Anesthesia QCDR form completed.        

## 2017-07-19 NOTE — Op Note (Signed)
[facility] Gastroenterology Patient Name: Sarah Sharp Procedure Date: 07/19/2017 9:41 AM MRN: [id number] Account #: [id number] Date of Birth: 08/07/1965 Admit Type: Outpatient Age: 52 Room: [facility] ENDO ROOM 1 Gender: Female Note Status: Finalized Procedure:            Colonoscopy Indications:          High risk colon cancer surveillance: Personal history                        of colonic polyps Providers:            Robert Bellow, MD Referring MD:         Glendon Axe (Referring MD) Medicines:            Monitored Anesthesia Care Complications:        No immediate complications. Procedure:            Pre-Anesthesia Assessment:                       - Prior to the procedure, a History and Physical was                        performed, and patient medications, allergies and                        sensitivities were reviewed. The patient's tolerance of                        previous anesthesia was reviewed.                       - The risks and benefits of the procedure and the                        sedation options and risks were discussed with the                        patient. All questions were answered and informed                        consent was obtained.                       After obtaining informed consent, the colonoscope was                        passed under direct vision. Throughout the procedure,                        the patient's blood pressure, pulse, and oxygen                        saturations were monitored continuously. The                        Colonoscope was introduced through the anus and                        advanced to the the cecum, identified by appendiceal  orifice and ileocecal valve. The colonoscopy was                        somewhat difficult due to significant looping and a                        tortuous colon. Successful completion of the procedure                        was aided by  applying abdominal pressure. The patient                        tolerated the procedure well. The quality of the bowel                        preparation was excellent. Findings:      A 9 mm polyp was found in the proximal transverse colon. The polyp was       sessile. This was biopsied with a cold forceps for histology.      A 12 mm polyp was found in the distal sigmoid colon. The polyp was       sessile. The polyp was removed with a cold snare. Resection and       retrieval were complete.      Multiple medium-mouthed diverticula were found in the sigmoid colon,       descending colon and hepatic flexure.      The retroflexed view of the distal rectum and anal verge was normal and       showed no anal or rectal abnormalities. Impression:           - One 9 mm polyp in the proximal transverse colon.                        Biopsied.                       - One 12 mm polyp in the distal sigmoid colon, removed                        with a cold snare. Resected and retrieved.                       - Diverticulosis in the sigmoid colon, in the                        descending colon and at the hepatic flexure.                       - The distal rectum and anal verge are normal on                        retroflexion view. Recommendation:       - Telephone endoscopist for pathology results in 1 week.                       - Repeat colonoscopy in 5 years for surveillance. Procedure Code(s):    --- Professional ---                       [phone], Colonoscopy, flexible; with removal  of tumor(s),                        polyp(s), or other lesion(s) by snare technique                       [id number], 59, Colonoscopy, flexible; with biopsy, single                        or multiple Diagnosis Code(s):    --- Professional ---                       Z86.010, Personal history of colonic polyps                       D12.3, Benign neoplasm of transverse colon (hepatic                        flexure or splenic  flexure)                       D12.5, Benign neoplasm of sigmoid colon                       K57.30, Diverticulosis of large intestine without                        perforation or abscess without bleeding CPT copyright 2017 American Medical Association. All rights reserved. The codes documented in this report are preliminary and upon coder review may  be revised to meet current compliance requirements. Robert Bellow, MD 07/19/2017 10:19:53 AM This report has been signed electronically. Number of Addenda: 0 Note Initiated On: 07/19/2017 9:41 AM Scope Withdrawal Time: 0 hours 11 minutes 39 seconds  Total Procedure Duration: 0 hours 24 minutes 0 seconds       [facility]

## 2017-07-19 NOTE — Anesthesia Procedure Notes (Signed)
Date/Time: 07/19/2017 9:50 AM Performed by: Doreen Salvage, CRNA Pre-anesthesia Checklist: Patient identified, Emergency Drugs available, Suction available and Patient being monitored Patient Re-evaluated:Patient Re-evaluated prior to induction Oxygen Delivery Method: Nasal cannula Induction Type: IV induction Dental Injury: Teeth and Oropharynx as per pre-operative assessment  Comments: Nasal cannula with etCO2 monitoring

## 2017-07-19 NOTE — H&P (Signed)
No change in clinical history or exam. Tolerated prep well.  

## 2017-07-19 NOTE — Anesthesia Preprocedure Evaluation (Addendum)
Anesthesia Evaluation  Patient identified by MRN, date of birth, ID band Patient awake    Reviewed: Allergy & Precautions, H&P , NPO status , Patient's Chart, lab work & pertinent test results, reviewed documented beta blocker date and time   Airway Mallampati: I  TM Distance: >3 FB Neck ROM: full    Dental  (+) Caps, Dental Advidsory Given, Teeth Intact   Pulmonary neg pulmonary ROS,           Cardiovascular Exercise Tolerance: Good hypertension, (-) angina(-) CAD, (-) Past MI, (-) Cardiac Stents and (-) CABG (-) dysrhythmias + Valvular Problems/Murmurs      Neuro/Psych PSYCHIATRIC DISORDERS Anxiety negative neurological ROS     GI/Hepatic negative GI ROS, Neg liver ROS,   Endo/Other  neg diabetesHypothyroidism Morbid obesity  Renal/GU negative Renal ROS  negative genitourinary   Musculoskeletal   Abdominal   Peds  Hematology negative hematology ROS (+)   Anesthesia Other Findings Past Medical History: No date: Anxiety No date: Heart murmur No date: Hypercholesterolemia No date: Hypertension No date: Hypothyroidism No date: Mitral valve disorder No date: Morbid obesity ([place]) No date: Thyroid disease   Reproductive/Obstetrics negative OB ROS                            Anesthesia Physical Anesthesia Plan  ASA: III  Anesthesia Plan: General   Post-op Pain Management:    Induction: Intravenous  PONV Risk Score and Plan: 3 and Propofol infusion and TIVA  Airway Management Planned: Nasal Cannula  Additional Equipment:   Intra-op Plan:   Post-operative Plan:   Informed Consent: I have reviewed the patients History and Physical, chart, labs and discussed the procedure including the risks, benefits and alternatives for the proposed anesthesia with the patient or authorized representative who has indicated his/her understanding and acceptance.   Dental Advisory Given  Plan  Discussed with: Anesthesiologist, CRNA and Surgeon  Anesthesia Plan Comments:         Anesthesia Quick Evaluation

## 2017-07-19 NOTE — Transfer of Care (Signed)
Immediate Anesthesia Transfer of Care Note  Patient: Sarah Sharp  Procedure(s) Performed: Procedure(s): COLONOSCOPY WITH PROPOFOL (N/A)  Patient Location: PACU and Endoscopy Unit  Anesthesia Type:General  Level of Consciousness: sedated  Airway & Oxygen Therapy: Patient Spontanous Breathing and Patient connected to nasal cannula oxygen  Post-op Assessment: Report given to RN and Post -op Vital signs reviewed and stable  Post vital signs: Reviewed and stable  Last Vitals:  Vitals:   07/19/17 0917 07/19/17 1020  BP: (!) 144/82 115/66  Pulse: 86 91  Resp: 16 18  Temp: 36.5 C (!) 36.1 C  SpO2: 153% 79%    Complications: No apparent anesthesia complications

## 2017-07-20 NOTE — Anesthesia Postprocedure Evaluation (Signed)
Anesthesia Post Note  Patient: QIANNA CLAGETT  Procedure(s) Performed: COLONOSCOPY WITH PROPOFOL (N/A )  Patient location during evaluation: Endoscopy Anesthesia Type: General Level of consciousness: awake and alert Pain management: pain level controlled Vital Signs Assessment: post-procedure vital signs reviewed and stable Respiratory status: spontaneous breathing, nonlabored ventilation, respiratory function stable and patient connected to nasal cannula oxygen Cardiovascular status: blood pressure returned to baseline and stable Postop Assessment: no apparent nausea or vomiting Anesthetic complications: no     Last Vitals:  Vitals:   07/19/17 1030 07/19/17 1040  BP: 122/70 131/78  Pulse: 83 80  Resp: 18 (!) 24  Temp:    SpO2: 100% 100%    Last Pain:  Vitals:   07/20/17 0911  TempSrc:   PainSc: 0-No pain                 Martha Clan

## 2017-07-21 LAB — SURGICAL PATHOLOGY

## 2017-07-24 ENCOUNTER — Encounter: Payer: Self-pay | Admitting: General Surgery

## 2017-07-26 ENCOUNTER — Telehealth: Payer: Self-pay

## 2017-07-26 NOTE — Telephone Encounter (Signed)
-----   Message from Robert Bellow, MD sent at 07/26/2017  3:22 PM EDT ----- Sessile serrated adenoma, 9 mm in diameter was identified at the time of her recent endoscopy.  This warrants a follow-up examination in 5 years.     Please notify the patient that the biopsy results were fine, but she does need to have a repeat exam in 5 years.  Please put in recalls.  Please send a copy to her PCP.

## 2017-07-26 NOTE — Telephone Encounter (Signed)
Notified patient as instructed, patient pleased. Discussed follow-up appointments, patient agrees  

## 2017-07-26 NOTE — Telephone Encounter (Signed)
Message left for patient to call for results. Patient placed in recalls.

## 2017-08-25 DIAGNOSIS — I10 Essential (primary) hypertension: Secondary | ICD-10-CM | POA: Diagnosis not present

## 2017-08-25 DIAGNOSIS — R739 Hyperglycemia, unspecified: Secondary | ICD-10-CM | POA: Diagnosis not present

## 2017-09-04 DIAGNOSIS — E039 Hypothyroidism, unspecified: Secondary | ICD-10-CM | POA: Diagnosis not present

## 2017-09-04 DIAGNOSIS — I10 Essential (primary) hypertension: Secondary | ICD-10-CM | POA: Diagnosis not present

## 2017-10-31 DIAGNOSIS — J069 Acute upper respiratory infection, unspecified: Secondary | ICD-10-CM | POA: Diagnosis not present

## 2017-11-07 ENCOUNTER — Other Ambulatory Visit: Payer: Self-pay | Admitting: Certified Nurse Midwife

## 2017-11-12 DIAGNOSIS — R05 Cough: Secondary | ICD-10-CM | POA: Diagnosis not present

## 2017-12-08 ENCOUNTER — Encounter: Payer: Self-pay | Admitting: Certified Nurse Midwife

## 2017-12-08 ENCOUNTER — Ambulatory Visit (INDEPENDENT_AMBULATORY_CARE_PROVIDER_SITE_OTHER): Payer: 59 | Admitting: Certified Nurse Midwife

## 2017-12-08 VITALS — BP 134/80 | HR 84 | Ht 65.0 in | Wt 283.0 lb

## 2017-12-08 DIAGNOSIS — N926 Irregular menstruation, unspecified: Secondary | ICD-10-CM

## 2017-12-08 DIAGNOSIS — Z01419 Encounter for gynecological examination (general) (routine) without abnormal findings: Secondary | ICD-10-CM

## 2017-12-08 DIAGNOSIS — Z1239 Encounter for other screening for malignant neoplasm of breast: Secondary | ICD-10-CM

## 2017-12-08 DIAGNOSIS — D259 Leiomyoma of uterus, unspecified: Secondary | ICD-10-CM

## 2017-12-08 DIAGNOSIS — Z8742 Personal history of other diseases of the female genital tract: Secondary | ICD-10-CM

## 2017-12-08 NOTE — Progress Notes (Addendum)
Gynecology Annual Exam  PCP: Glendon Axe, MD  Chief Complaint:  Chief Complaint  Patient presents with  . Gynecologic Exam    History of Present Illness:Sarah Sharp is a 52 year old Caucasian/White female , G 0 , who presents for her annual exam. She has a history of DUB and she currently takes progesterone 200 mgm from days 14-25 each cycle. She had no withdrawal bleeding in March and October and November.  Her menses are irregular They occur every 24-59 days and she has not had any bleeding in 71 days.  They usually last 4 days , are light to  medium flow with an occasional heavy flow day. Her LMP was 09/29/2017.  Denies hot flashes, but has been feeling warmer at night.  She reports dysmenorrhea. She uses ibuprofen 600 mgm with symptomatic relief.  The patient's past medical history is notable for a history of hypothyroidism, anxiety, hyperlipidemia, hypertension, and obesity. Her current BMI is 47.09 kg/m2. Her PCP is Glendon Axe. An ultrasound 11/07/2014 revealed two fibroids: a posterior intramural fibroid measuring 50x39x41 mm and another anterior fibroid measuring 14x8x11 mm. There was a small paraovarian cyst on the right and a simple cyst on the left ovary 40x36x76mm in size.   Since her last annual GYN exam dated 12/05/2016, she had a colonoscopy by Dr Bary Castilla. Had 2 adenomatous polyps removed and next colonoscopy due in 5 years.  Recently she had an URI and has been having a persistent post viral infection cough. Is currently taking a OTC drink for cough.  She is not sexually active.  Her most recent pap smear was obtained 12/05/2016 and was with negative cells and negative HPV DNA.  Her most recent mammogram obtained on 12/07/16 was normal.  There is no family history of breast cancer.  There is a possible family history of ovarian cancer vs uterine cancer in her maternal grandmother. Genetic testing has not been done. Her father and paternal grandmother have colon  cancer. The patient does not do monthly self breast exams.  The patient does not smoke.  The patient does drink rarely.  The patient does not use illegal drugs.  The patient does not exercise.  The patient does get adequate calcium in her diet.  She had a recent cholesterol screen in 2019 by PCP that was normal except for mildly elevated  triglyceride level.      Review of Systems: Review of Systems  Constitutional: Negative for chills, fever and weight loss.  HENT: Negative for congestion, sinus pain and sore throat.   Eyes: Negative for blurred vision and pain.  Respiratory: Positive for cough. Negative for hemoptysis, shortness of breath and wheezing.   Cardiovascular: Negative for chest pain, palpitations and leg swelling.  Gastrointestinal: Negative for abdominal pain, blood in stool, diarrhea, heartburn, nausea and vomiting.  Genitourinary: Negative for dysuria, frequency, hematuria and urgency.  Musculoskeletal: Negative for back pain, joint pain and myalgias.  Skin: Negative for itching and rash.  Neurological: Negative for dizziness, tingling and headaches.  Endo/Heme/Allergies: Negative for environmental allergies and polydipsia. Does not bruise/bleed easily.       Negative for hirsutism   Psychiatric/Behavioral: Negative for depression. The patient is nervous/anxious. The patient does not have insomnia.     Past Medical History:  Past Medical History:  Diagnosis Date  . Anxiety   . Heart murmur   . Hypercholesterolemia   . Hypertension   . Hypothyroidism   . Mitral valve disorder   .  Morbid obesity ([place])   . Thyroid disease     Past Surgical History:  Past Surgical History:  Procedure Laterality Date  . COLONOSCOPY  2002,2009, 2014; 07/2017   hx of colon cancer in multiple 1st degree relatives. Findings, multiple small & large mouthed diverticula in sigmoid colon  . COLONOSCOPY WITH PROPOFOL N/A 07/19/2017   Procedure: COLONOSCOPY WITH PROPOFOL;  Surgeon:  Robert Bellow, MD;  Location: [facility] ENDOSCOPY;  Service: Endoscopy;  Laterality: N/A;  . DILATION AND CURETTAGE OF UTERUS  2007   endometrial polyps  . WISDOM TOOTH EXTRACTION      Family History:  Family History  Problem Relation Age of Onset  . Cancer Father 46       colon cancer  . Colon polyps Sister   . Anxiety disorder Sister   . Hypertension Mother   . Depression Mother   . Bladder Cancer Maternal Grandmother 93  . Ovarian cancer Maternal Grandmother        vs uterine cancer had hysterectomy  . Hypertension Maternal Grandfather   . Cancer Paternal Grandmother        colon  . Thyroid disease Paternal Grandmother   . Bipolar disorder Sister   . Colon cancer Other   . Breast cancer Neg Hx     Social History:  Social History   Socioeconomic History  . Marital status: Single    Spouse name: Not on file  . Number of children: 0  . Years of education: 54  . Highest education level: Associate degree: academic program  Occupational History  . Occupation: Wellsite geologist  Social Needs  . Financial resource strain: Not on file  . Food insecurity:    Worry: Not on file    Inability: Not on file  . Transportation needs:    Medical: Not on file    Non-medical: Not on file  Tobacco Use  . Smoking status: Never Smoker  . Smokeless tobacco: Never Used  Substance and Sexual Activity  . Alcohol use: Yes    Comment: rarely  . Drug use: No  . Sexual activity: Not Currently    Birth control/protection: None  Lifestyle  . Physical activity:    Days per week: 0 days    Minutes per session: 0 min  . Stress: Not at all  Relationships  . Social connections:    Talks on phone: Twice a week    Gets together: Twice a week    Attends religious service: More than 4 times per year    Active member of club or organization: No    Attends meetings of clubs or organizations: Never    Relationship status: Never married  . Intimate partner violence:    Fear  of current or ex partner: No    Emotionally abused: No    Physically abused: No    Forced sexual activity: No  Other Topics Concern  . Not on file  Social History Narrative  . Not on file    Allergies:  No Known Allergies  Medications:  Current Outpatient Medications on File Prior to Visit  Medication Sig Dispense Refill  . amLODipine (NORVASC) 10 MG tablet TAKE 1 TABLET BY MOUTH ONCE DAILY    . Cholecalciferol (SM VITAMIN D3) 4000 UNITS CAPS Take 2 capsules by mouth daily.    . fluticasone (FLONASE) 50 MCG/ACT nasal spray Place 1 spray into the nose 2 (two) times daily.    Marland Kitchen ibuprofen (ADVIL,MOTRIN) 200 MG tablet Take 200 mg by  mouth every 6 (six) hours as needed for pain.    Marland Kitchen levothyroxine (SYNTHROID, LEVOTHROID) 125 MCG tablet TAKE 1 TABLET BY MOUTH ONCE DAILY ON AN EMPTY STOMACH  WITH A GLASS OF WATER 30-60 MINUTES BEFORE BREAKFAST    . Multiple Vitamins-Minerals (MULTIVITAMIN ADULT PO) Take by mouth.    . nortriptyline (PAMELOR) 10 MG capsule TAKE 2 CAPSULES BY MOUTH  NIGHTLY    . progesterone (PROMETRIUM) 200 MG capsule TAKE 1 CAPSULE BY MOUTH  DAILY DAYS 14-25 OF CYCLE 36 capsule 0  . lisinopril-hydrochlorothiazide (PRINZIDE,ZESTORETIC) 10-12.5 MG tablet Take by mouth.     No current facility-administered medications on file prior to visit.    Physical Exam Vitals: BP 134/80   Pulse 84   Ht 5\' 5"  (1.651 m)   Wt 283 lb (128.4 kg)   LMP 09/29/2017 (Exact Date)   BMI 47.09 kg/m   General: Obese WF in NAD HEENT: normocephalic, anicteric Neck: no thyroid enlargement, no palpable nodules, no cervical lymphadenopathy  Pulmonary: No increased work of breathing, CTAB Cardiovascular: RRR, with Grade II/VI systolic murmur best heard in the pulmonic area Breast: Breast symmetrical, no tenderness, no palpable nodules or masses, no skin or nipple retraction present, no nipple discharge.  No axillary, infraclavicular or supraclavicular lymphadenopathy. Abdomen: Soft, non-tender,  obese, non-distended.  Umbilicus without lesions.  No hepatomegaly or masses palpable. No evidence of hernia. Genitourinary:  External: Normal external female genitalia.  Normal urethral meatus, normal Bartholin's and Skene's glands.    Vagina: Normal vaginal mucosa, no evidence of prolapse.    Cervix: Grossly normal in appearance, no bleeding, non-tender, pink, nullip  Uterus: Anteverted, globular, mobile, and non-tender  Adnexa: No adnexal masses, non-tender. Difficult exam due to body habitus.  Rectal: deferred  Lymphatic: no evidence of inguinal lymphadenopathy Extremities: no edema, erythema, or tenderness Neurologic: Grossly intact Psychiatric: mood appropriate, affect full     Assessment: 52 y.o. G0P0 annual gyn exam History of DUB, currently taking Progestin cyclically, no withdrawal bleeding  x 2 months Family history of colon and either ovarian or uterine cancer    Plan:   1) Breast cancer screening - recommend monthly self breast exam and annual screening mammograms. Mammogram was ordered today. Patient to call  for appointment  2) Cervical cancer screening - Pap was not done. ASCCP guidelines and rational discussed.  Patient opts for every 3 years screening interval. Next due in 2 years  3) DUB: Continue cycling with Prometrium 200 mgm days 14-25 of each cycle. FSH and LH today  4) Routine healthcare maintenance including cholesterol and diabetes screening managed by PCP.   5) Will call with results of labs. Consider stopping Prometrium if FSH and LH are elevated.  Dalia Heading, CNM

## 2017-12-09 DIAGNOSIS — Z8742 Personal history of other diseases of the female genital tract: Secondary | ICD-10-CM | POA: Insufficient documentation

## 2017-12-09 DIAGNOSIS — D259 Leiomyoma of uterus, unspecified: Secondary | ICD-10-CM | POA: Insufficient documentation

## 2017-12-09 LAB — FSH/LH
FSH: 11.2 m[IU]/mL
LH: 9.5 m[IU]/mL

## 2017-12-13 ENCOUNTER — Encounter: Payer: Self-pay | Admitting: Radiology

## 2017-12-13 ENCOUNTER — Ambulatory Visit
Admission: RE | Admit: 2017-12-13 | Discharge: 2017-12-13 | Disposition: A | Discharge status: Home / Self Care | Payer: 59 | Source: Ambulatory Visit | Attending: Certified Nurse Midwife | Admitting: Certified Nurse Midwife

## 2017-12-13 DIAGNOSIS — Z1231 Encounter for screening mammogram for malignant neoplasm of breast: Secondary | ICD-10-CM | POA: Diagnosis not present

## 2017-12-13 DIAGNOSIS — Z1239 Encounter for other screening for malignant neoplasm of breast: Secondary | ICD-10-CM

## 2018-01-23 DIAGNOSIS — M25561 Pain in right knee: Secondary | ICD-10-CM | POA: Diagnosis not present

## 2018-01-27 ENCOUNTER — Other Ambulatory Visit: Payer: Self-pay | Admitting: Certified Nurse Midwife

## 2018-01-30 ENCOUNTER — Other Ambulatory Visit: Payer: Self-pay | Admitting: Certified Nurse Midwife

## 2018-01-30 MED ORDER — PROGESTERONE MICRONIZED 200 MG PO CAPS: ORAL_CAPSULE | ORAL | 3 refills | Status: DC

## 2018-02-23 DIAGNOSIS — E039 Hypothyroidism, unspecified: Secondary | ICD-10-CM | POA: Diagnosis not present

## 2018-03-05 DIAGNOSIS — E039 Hypothyroidism, unspecified: Secondary | ICD-10-CM | POA: Diagnosis not present

## 2018-03-05 DIAGNOSIS — Z0000 Encounter for general adult medical examination without abnormal findings: Secondary | ICD-10-CM | POA: Diagnosis not present

## 2018-04-16 IMAGING — MG DIGITAL SCREENING BILATERAL MAMMOGRAM WITH CAD
4 series · 4 of 4 positions shown · non-contrast
Comparison: None.

CLINICAL DATA: Screening.

EXAM:
DIGITAL SCREENING BILATERAL MAMMOGRAM WITH CAD

[R CC]
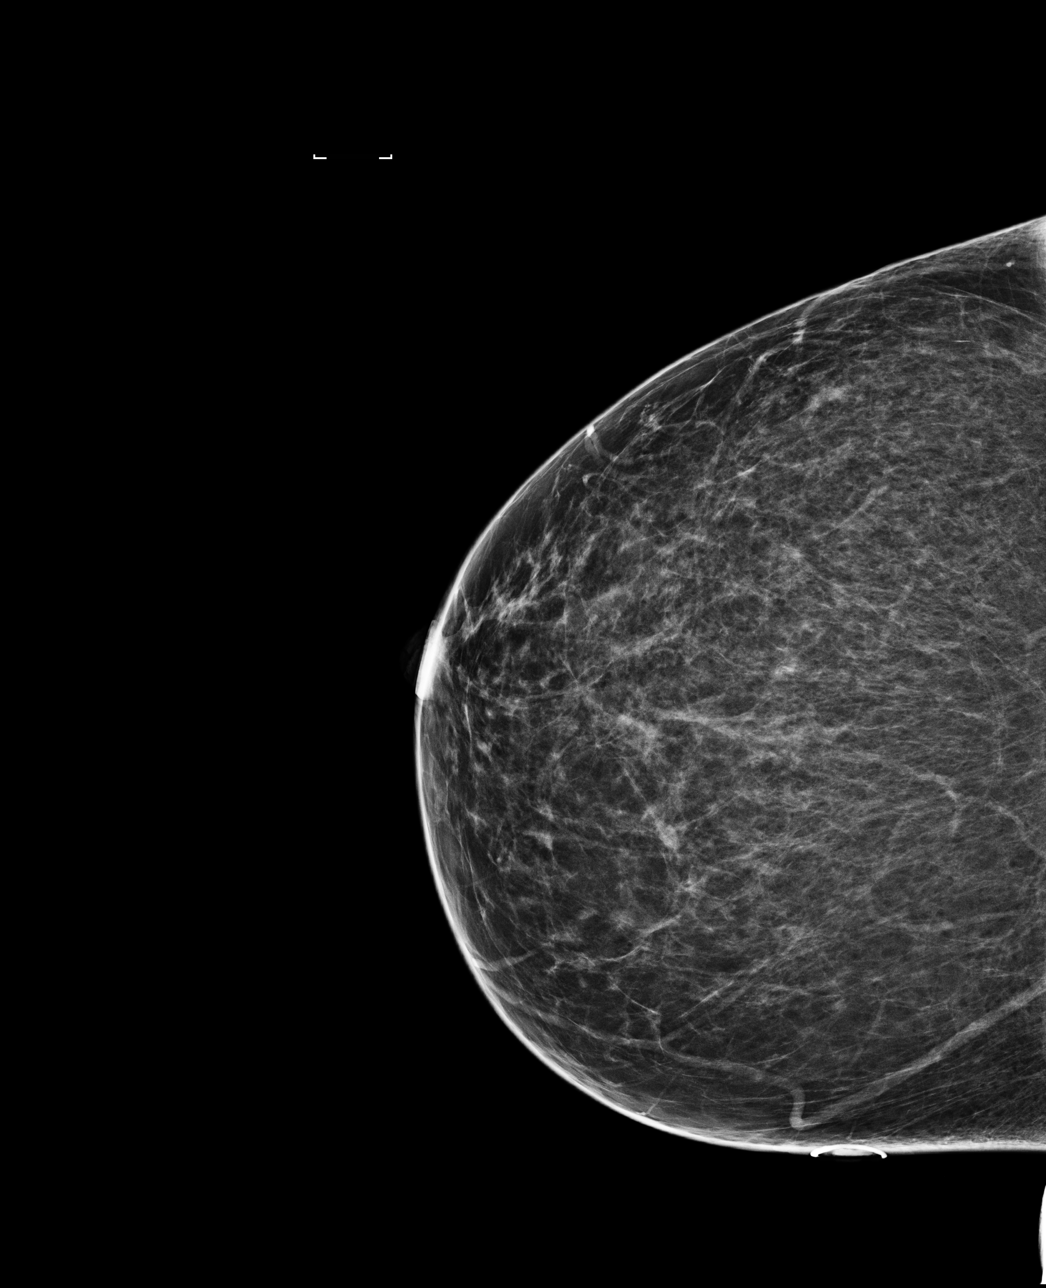

[L MLO]
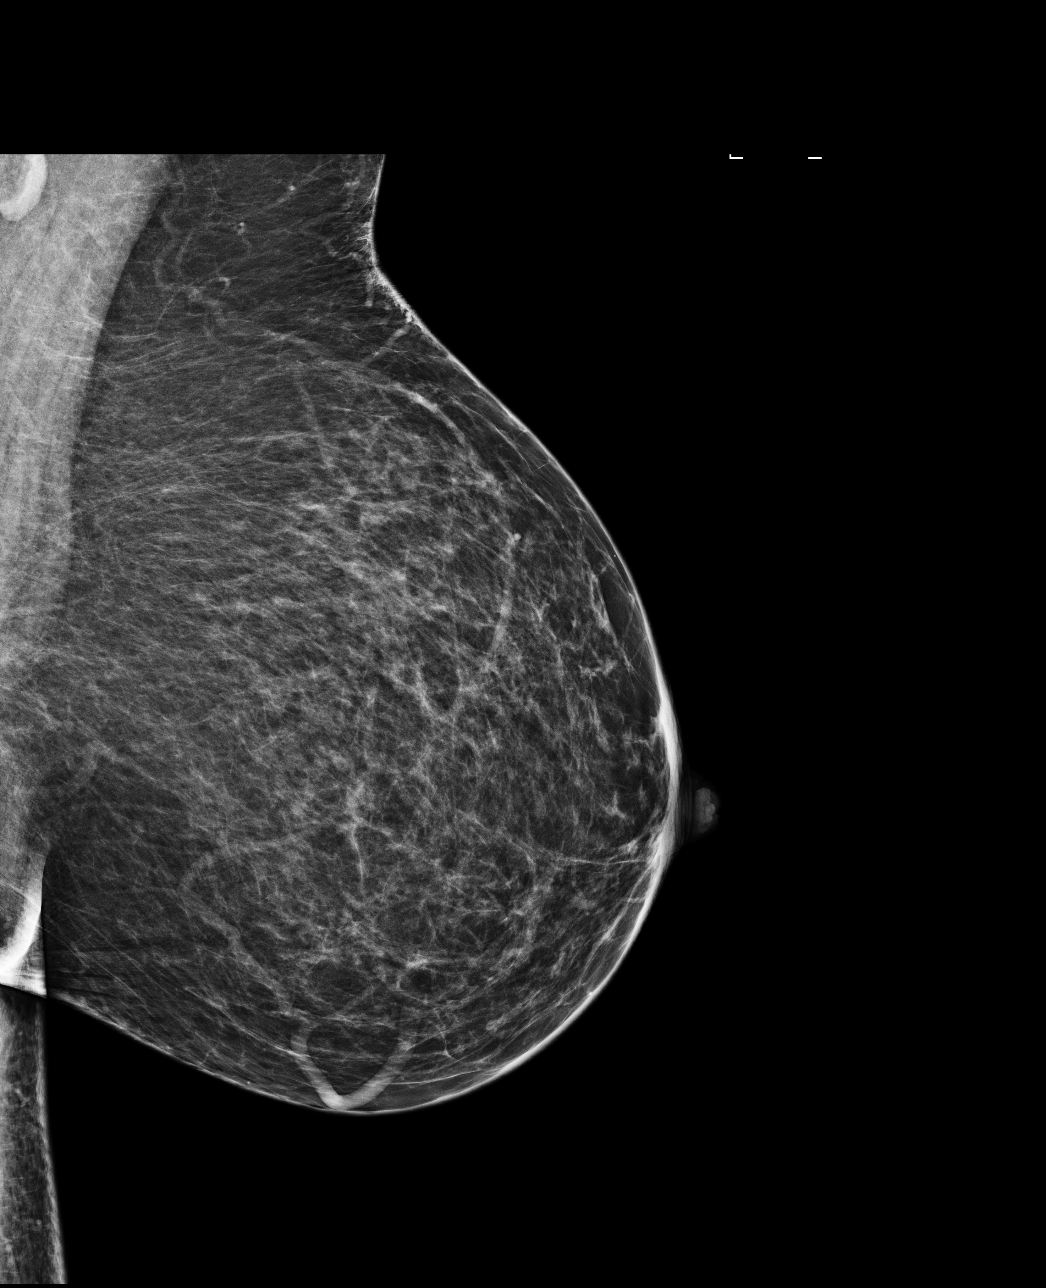

[R MLO]
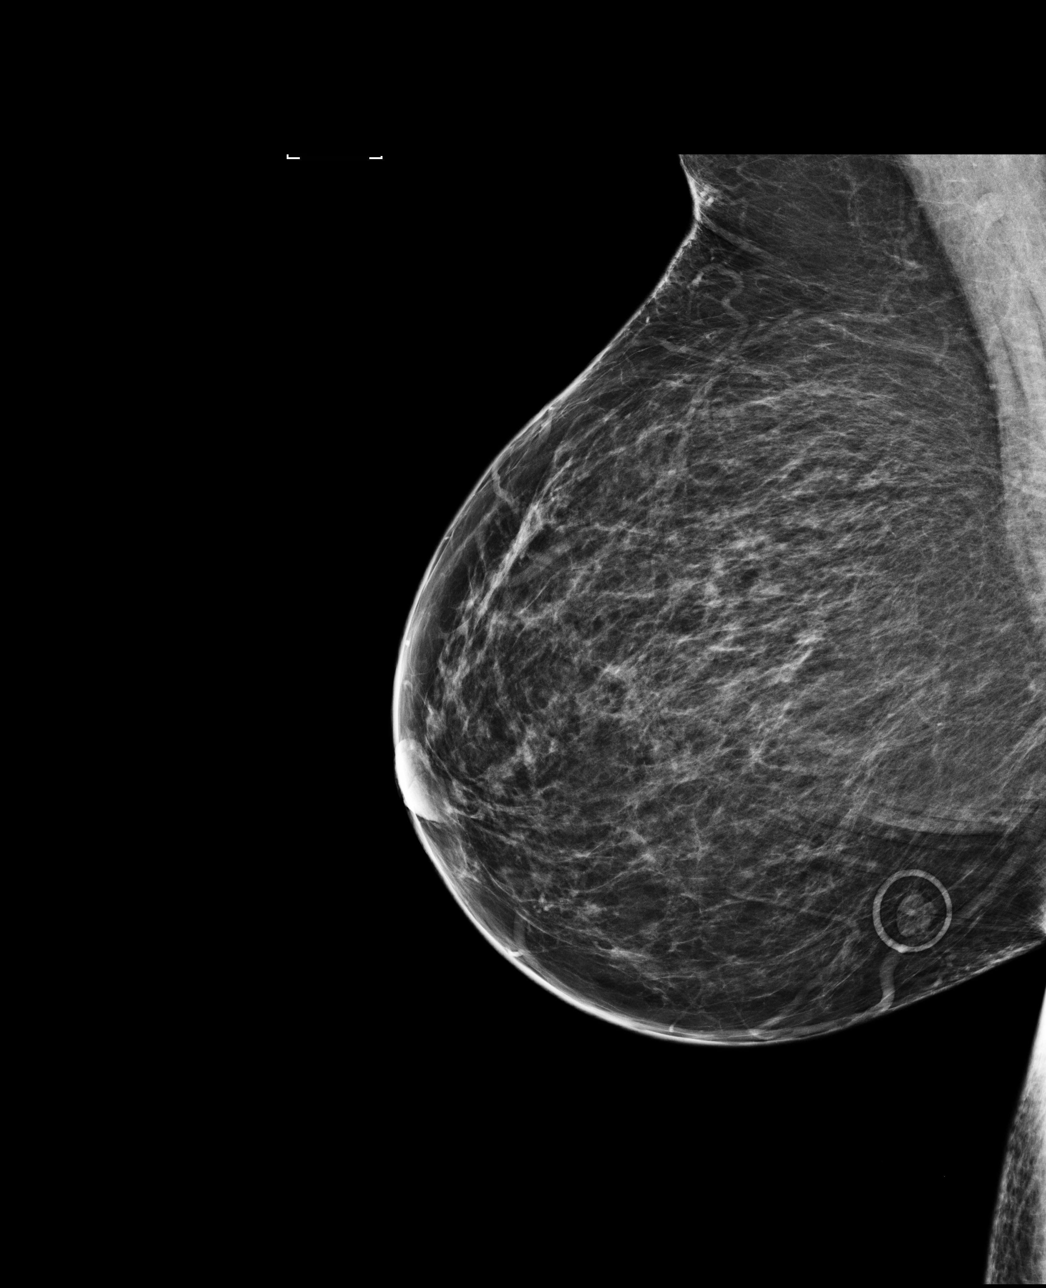

[L CC]
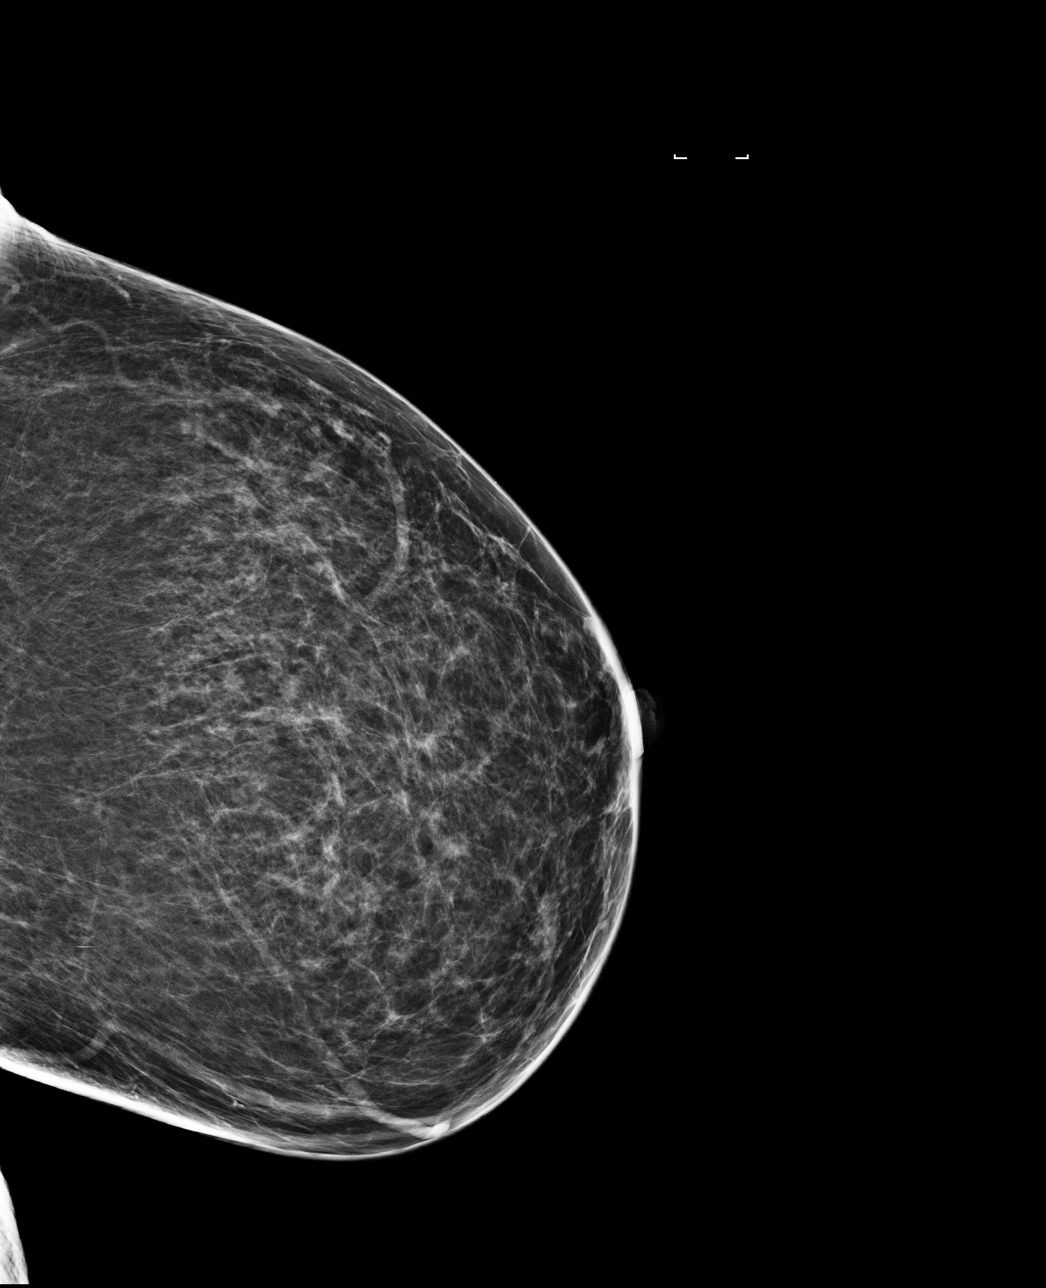

[4 of 4 positions shown; findings below may reference images not displayed]

ACR Breast Density Category b: There are scattered areas of
fibroglandular density.
FINDINGS: There are no findings suspicious for malignancy. Images were
processed with CAD.
IMPRESSION: No mammographic evidence of malignancy. A result letter of this
screening mammogram will be mailed directly to the patient.

RECOMMENDATION:
Screening mammogram in one year. (Code:[id number])

BI-RADS CATEGORY  1: Negative.

## 2018-11-08 ENCOUNTER — Other Ambulatory Visit: Payer: Self-pay | Admitting: Certified Nurse Midwife

## 2018-11-08 DIAGNOSIS — Z1231 Encounter for screening mammogram for malignant neoplasm of breast: Secondary | ICD-10-CM

## 2018-12-09 ENCOUNTER — Other Ambulatory Visit: Payer: Self-pay

## 2018-12-09 ENCOUNTER — Ambulatory Visit
Admission: EM | Admit: 2018-12-09 | Discharge: 2018-12-09 | Disposition: A | Discharge status: Home / Self Care | Payer: 59 | Attending: Family Medicine | Admitting: Family Medicine

## 2018-12-09 ENCOUNTER — Encounter: Payer: Self-pay | Admitting: Emergency Medicine

## 2018-12-09 DIAGNOSIS — Z20828 Contact with and (suspected) exposure to other viral communicable diseases: Secondary | ICD-10-CM

## 2018-12-09 DIAGNOSIS — R05 Cough: Secondary | ICD-10-CM | POA: Diagnosis not present

## 2018-12-09 DIAGNOSIS — R519 Headache, unspecified: Secondary | ICD-10-CM

## 2018-12-09 DIAGNOSIS — R0981 Nasal congestion: Secondary | ICD-10-CM | POA: Diagnosis not present

## 2018-12-09 DIAGNOSIS — J989 Respiratory disorder, unspecified: Secondary | ICD-10-CM

## 2018-12-09 DIAGNOSIS — Z20822 Contact with and (suspected) exposure to covid-19: Secondary | ICD-10-CM

## 2018-12-09 MED ORDER — IPRATROPIUM BROMIDE 0.06 % NA SOLN: 2.0000 | Freq: Four times a day (QID) | NASAL | 0 refills | Status: DC | PRN

## 2018-12-09 MED ORDER — BENZONATATE 100 MG PO CAPS: 100.0000 mg | ORAL_CAPSULE | Freq: Three times a day (TID) | ORAL | 0 refills | Status: DC | PRN

## 2018-12-09 NOTE — ED Provider Notes (Signed)
[facility]    CSN: [id number] Arrival date & time: 12/09/18  1256  History   Chief Complaint Chief Complaint  Patient presents with  . Sinus Problem    APPT  . Headache  . Cough  . chest congestion   HPI   53 year old female presents with the above complaints.  Patient reports that she has had respiratory symptoms for the past 3 to 4 days.  She reports headache, congestion, cough.  No direct Covid exposure.  However, she is concerned about COVID-19.  She has been taking over-the-counter Allegra for her symptoms without relief.  No fever at home.  Temperature mildly elevated here.  No known exacerbating factors.  She is currently experiencing headache which she rates as 4/10 in severity.  No other associated symptoms.  No other complaints.  PMH, Surgical Hx, Family Hx, Social History reviewed and updated as below.  Past Medical History:  Diagnosis Date  . Anxiety   . Heart murmur   . Hypercholesterolemia   . Hypertension   . Hypothyroidism   . Mitral valve disorder   . Morbid obesity ([place])   . Thyroid disease    Patient Active Problem List   Diagnosis Date Noted  . Uterine leiomyoma 12/09/2017  . History of dysfunctional uterine bleeding 12/09/2017  . Family history of ovarian cancer 12/05/2016  . Essential hypertension 12/05/2016  . Hypothyroidism 12/05/2016  . Anxiety and depression 12/05/2016  . Morbid obesity ([place]) 12/05/2016  . Family history of colon cancer 05/29/2012   Past Surgical History:  Procedure Laterality Date  . COLONOSCOPY  2002,2009, 2014; 07/2017   hx of colon cancer in multiple 1st degree relatives. Findings, multiple small & large mouthed diverticula in sigmoid colon  . COLONOSCOPY WITH PROPOFOL N/A 07/19/2017   Procedure: COLONOSCOPY WITH PROPOFOL;  Surgeon: Robert Bellow, MD;  Location: [facility] ENDOSCOPY;  Service: Endoscopy;  Laterality: N/A;  . DILATION AND CURETTAGE OF UTERUS  2007   endometrial polyps  . WISDOM TOOTH  EXTRACTION     OB History    Gravida  0   Para      Term      Preterm      AB      Living        SAB      TAB      Ectopic      Multiple      Live Births           Obstetric Comments  Age first period 4       Home Medications    Prior to Admission medications   Medication Sig Start Date End Date Taking? Authorizing Provider  amLODipine (NORVASC) 10 MG tablet TAKE 1 TABLET BY MOUTH ONCE DAILY 03/01/17  Yes [provider]  Cholecalciferol (SM VITAMIN D3) 4000 UNITS CAPS Take 2 capsules by mouth daily.   Yes [provider]  ibuprofen (ADVIL,MOTRIN) 200 MG tablet Take 200 mg by mouth every 6 (six) hours as needed for pain.   Yes [provider]  levothyroxine (SYNTHROID, LEVOTHROID) 125 MCG tablet TAKE 1 TABLET BY MOUTH ONCE DAILY ON AN EMPTY STOMACH  WITH A GLASS OF WATER 30-60 MINUTES BEFORE BREAKFAST 10/28/16  Yes [provider]  lisinopril-hydrochlorothiazide (PRINZIDE,ZESTORETIC) 10-12.5 MG tablet Take by mouth. 11/03/16 12/09/18 Yes [provider]  Multiple Vitamins-Minerals (MULTIVITAMIN ADULT PO) Take by mouth.   Yes [provider]  nortriptyline (PAMELOR) 10 MG capsule TAKE 2 CAPSULES BY MOUTH  NIGHTLY  11/23/16  Yes [provider]  progesterone (PROMETRIUM) 200 MG capsule TAKE 1 CAPSULE BY MOUTH  DAILY DAYS 14-25 OF CYCLE 01/30/18  Yes Dalia Heading, CNM  benzonatate (TESSALON) 100 MG capsule Take 1 capsule (100 mg total) by mouth 3 (three) times daily as needed for cough. 12/09/18   Coral Spikes, DO  ipratropium (ATROVENT) 0.06 % nasal spray Place 2 sprays into both nostrils 4 (four) times daily as needed (Congestion). 12/09/18   Coral Spikes, DO    Family History Family History  Problem Relation Age of Onset  . Cancer Father 56       colon cancer  . Colon polyps Sister   . Anxiety disorder Sister   . Hypertension Mother   . Depression Mother   . Bladder Cancer Maternal  Grandmother 93  . Ovarian cancer Maternal Grandmother        vs uterine cancer had hysterectomy  . Hypertension Maternal Grandfather   . Cancer Paternal Grandmother        colon  . Thyroid disease Paternal Grandmother   . Bipolar disorder Sister   . Colon cancer Other   . Breast cancer Neg Hx     Social History Social History   Tobacco Use  . Smoking status: Never Smoker  . Smokeless tobacco: Never Used  Substance Use Topics  . Alcohol use: Yes    Comment: rarely  . Drug use: No     Allergies   Patient has no known allergies.   Review of Systems Review of Systems  Constitutional: Negative for fever.  HENT: Positive for congestion.   Respiratory: Positive for cough.   Neurological: Positive for headaches.   Physical Exam Triage Vital Signs ED Triage Vitals  Enc Vitals Group     BP 12/09/18 1317 138/75     Pulse Rate 12/09/18 1317 99     Resp 12/09/18 1317 18     Temp 12/09/18 1317 99.5 F (37.5 C)     Temp Source 12/09/18 1317 Oral     SpO2 12/09/18 1317 100 %     Weight 12/09/18 1316 270 lb (122.5 kg)     Height 12/09/18 1316 5\' 5"  (1.651 m)     Head Circumference --      Peak Flow --      Pain Score 12/09/18 1316 4     Pain Loc --      Pain Edu? --      Excl. in [place]? --    Updated Vital Signs BP 138/75 (BP Location: Left Arm)   Pulse 99   Temp 99.5 F (37.5 C) (Oral)   Resp 18   Ht 5\' 5"  (1.651 m)   Wt 122.5 kg   LMP 11/25/2018 (Exact Date)   SpO2 100%   BMI 44.93 kg/m   Visual Acuity Right Eye Distance:   Left Eye Distance:   Bilateral Distance:    Right Eye Near:   Left Eye Near:    Bilateral Near:     Physical Exam Vitals signs and nursing note reviewed.  Constitutional:      General: She is not in acute distress.    Appearance: Normal appearance. She is not ill-appearing.     Comments: Morbidly obese.  HENT:     Head: Normocephalic and atraumatic.     Mouth/Throat:     Pharynx: Oropharynx is clear. No posterior oropharyngeal  erythema.  Eyes:     General:        Right  eye: No discharge.        Left eye: No discharge.     Conjunctiva/sclera: Conjunctivae normal.  Cardiovascular:     Rate and Rhythm: Normal rate and regular rhythm.     Heart sounds: No murmur.  Pulmonary:     Effort: Pulmonary effort is normal.     Breath sounds: Normal breath sounds. No wheezing, rhonchi or rales.  Neurological:     Mental Status: She is alert.  Psychiatric:        Mood and Affect: Mood normal.        Behavior: Behavior normal.    UC Treatments / Results  Labs (all labs ordered are listed, but only abnormal results are displayed) Labs Reviewed  NOVEL CORONAVIRUS, NAA (HOSP ORDER, SEND-OUT TO REF LAB; TAT 18-24 HRS)    EKG   Radiology No results found.  Procedures Procedures (including critical care time)  Medications Ordered in UC Medications - No data to display  Initial Impression / Assessment and Plan / UC Course  I have reviewed the triage vital signs and the nursing notes.  Pertinent labs & imaging results that were available during my care of the patient were reviewed by me and considered in my medical decision making (see chart for details).    53 year old female presents with a suspected viral respiratory illness.  Awaiting Covid test results.  Tessalon Perles and Atrovent nasal spray for symptomatic treatment.  Supportive care.  Final Clinical Impressions(s) / UC Diagnoses   Final diagnoses:  Respiratory illness  Encounter for laboratory testing for COVID-19 virus     Discharge Instructions     Rest.  Medications as prescribed.  Awaiting COVID testing (should be available in 24-48 hours).  Take care  Dr. Lacinda Axon    ED Prescriptions    Medication Sig Dispense Auth. Provider   benzonatate (TESSALON) 100 MG capsule Take 1 capsule (100 mg total) by mouth 3 (three) times daily as needed for cough. 30 capsule Santa Abdelrahman G, DO   ipratropium (ATROVENT) 0.06 % nasal spray Place 2 sprays  into both nostrils 4 (four) times daily as needed (Congestion). 15 mL Coral Spikes, DO     PDMP not reviewed this encounter.   Coral Spikes, [place] 12/09/18 1554

## 2018-12-09 NOTE — ED Triage Notes (Signed)
Patient in today c/o sinus problems and cough x 3-4 days. Headache and chest congestion/tightness today. Patient denies fever. Patient has not had a direct exposure to covid, but exposed to a person that was exposed to Covid. Patient has been taking OTC Allegra for her symptoms.

## 2018-12-09 NOTE — Discharge Instructions (Signed)
Rest.  Medications as prescribed.  Awaiting COVID testing (should be available in 24-48 hours).  Take care  Dr. Lacinda Axon

## 2018-12-10 LAB — NOVEL CORONAVIRUS, NAA (HOSP ORDER, SEND-OUT TO REF LAB; TAT 18-24 HRS): SARS-CoV-2, NAA: NOT DETECTED

## 2018-12-11 ENCOUNTER — Ambulatory Visit: Payer: 59 | Admitting: Certified Nurse Midwife

## 2018-12-19 ENCOUNTER — Other Ambulatory Visit: Payer: Self-pay

## 2018-12-19 ENCOUNTER — Ambulatory Visit
Admission: RE | Admit: 2018-12-19 | Discharge: 2018-12-19 | Disposition: A | Discharge status: Home / Self Care | Payer: 59 | Source: Ambulatory Visit | Attending: Certified Nurse Midwife | Admitting: Certified Nurse Midwife

## 2018-12-19 DIAGNOSIS — Z1231 Encounter for screening mammogram for malignant neoplasm of breast: Secondary | ICD-10-CM | POA: Diagnosis not present

## 2018-12-26 ENCOUNTER — Other Ambulatory Visit: Payer: Self-pay | Admitting: Certified Nurse Midwife

## 2019-01-14 NOTE — Progress Notes (Signed)
Gynecology Annual Exam  PCP: Baxter Hire, MD  Chief Complaint:  Chief Complaint  Patient presents with  . Gynecologic Exam    History of Present Illness:Sarah Sharp is a 53 year old Caucasian/White female , G 0 , who presents for her annual exam. She has a history of DUB and she currently takes progesterone 200 mgm from days 14-25 each cycle. She had no withdrawal bleeding in June, July, August. Her withdrawal bleed May 31-2 June was more of a bloody discharge. Had one day of bloody discharge 13 June and again from 26-29 June. Has had no withdrawal yet in December. Her LMP was 11-24-2018. Her withdrawal bleeds have become much lighter over the past year. She has also had more hot flashes.    She reports dysmenorrhea. She uses ibuprofen 600 mgm with symptomatic relief.   The patient's past medical history is notable for a history of hypothyroidism, anxiety, hyperlipidemia, hypertension, and obesity. Her current BMI is 45.43kg/m2. Her PCP is Harrel Lemon. An ultrasound 11/07/2014 revealed two fibroids: a posterior intramural fibroid measuring 50x39x41 mm and another anterior fibroid measuring 14x8x11 mm. There was a small paraovarian cyst on the right and a simple cyst on the left ovary 40x36x58mm in size.   Since her last annual GYN exam dated 12/08/2017, she lost 10# with intermittent fasting. Colonoscopy by Dr Bary Castilla  07/19/2017. Had 2 adenomatous polyps removed and next colonoscopy due in 2024 (5 years).  She is not sexually active.  Her most recent pap smear was obtained 12/05/2016 and was with negative cells and negative HPV DNA.  Her most recent mammogram obtained on 12/19/2018 was normal.  There is no family history of breast cancer.  There is a possible family history of ovarian cancer vs uterine cancer in her maternal grandmother. Genetic testing has not been done. Her father and paternal grandmother have colon cancer. The patient does not do monthly self breast  exams.  The patient does not smoke.  The patient does drink rarely.  The patient does not use illegal drugs.  The patient does not exercise.  The patient does get adequate calcium in her diet and with her supplements (lactose intolerant)  She had a recent cholesterol screen in 2020 by PCP that was normal except for mildly elevated  triglyceride level.      Review of Systems: Review of Systems  Constitutional: Negative for chills, fever and weight loss.  HENT: Negative for congestion, sinus pain and sore throat.   Eyes: Negative for blurred vision and pain.  Respiratory: Negative for cough, hemoptysis, shortness of breath and wheezing.   Cardiovascular: Negative for chest pain, palpitations and leg swelling.  Gastrointestinal: Negative for abdominal pain, blood in stool, diarrhea, heartburn, nausea and vomiting.  Genitourinary: Negative for dysuria, frequency, hematuria and urgency.  Musculoskeletal: Negative for back pain, joint pain and myalgias.  Skin: Negative for itching and rash.  Neurological: Negative for dizziness, tingling and headaches.  Endo/Heme/Allergies: Positive for environmental allergies (sneezing). Negative for polydipsia. Does not bruise/bleed easily.       Positive for hot flashes  Psychiatric/Behavioral: Negative for depression. The patient is not nervous/anxious and does not have insomnia.     Past Medical History:  Past Medical History:  Diagnosis Date  . Anxiety   . Heart murmur   . Hypercholesterolemia   . Hypertension   . Hypothyroidism   . Mitral valve disorder   . Morbid obesity ([place])   . Thyroid disease  Past Surgical History:  Past Surgical History:  Procedure Laterality Date  . COLONOSCOPY  2002,2009, 2014; 07/2017   hx of colon cancer in multiple 1st degree relatives. Findings, multiple small & large mouthed diverticula in sigmoid colon  . COLONOSCOPY WITH PROPOFOL N/A 07/19/2017   Procedure: COLONOSCOPY WITH PROPOFOL;  Surgeon: Robert Bellow, MD;  Location: [facility] ENDOSCOPY;  Service: Endoscopy;  Laterality: N/A;  . DILATION AND CURETTAGE OF UTERUS  2007   endometrial polyps  . WISDOM TOOTH EXTRACTION      Family History:  Family History  Problem Relation Age of Onset  . Cancer Father 37       colon cancer  . Colon polyps Sister   . Anxiety disorder Sister   . Hypertension Mother   . Depression Mother   . Bladder Cancer Maternal Grandmother 93  . Ovarian cancer Maternal Grandmother        vs uterine cancer had hysterectomy  . Hypertension Maternal Grandfather   . Cancer Paternal Grandmother        colon  . Thyroid disease Paternal Grandmother   . Bipolar disorder Sister   . Colon cancer Other   . Breast cancer Neg Hx     Social History:  Social History   Socioeconomic History  . Marital status: Single    Spouse name: Not on file  . Number of children: 0  . Years of education: 76  . Highest education level: Associate degree: academic program  Occupational History  . Occupation: Wellsite geologist  Tobacco Use  . Smoking status: Never Smoker  . Smokeless tobacco: Never Used  Substance and Sexual Activity  . Alcohol use: Yes    Comment: rarely  . Drug use: No  . Sexual activity: Not Currently    Birth control/protection: None  Other Topics Concern  . Not on file  Social History Narrative  . Not on file   Social Determinants of Health   Financial Resource Strain:   . Difficulty of Paying Living Expenses: Not on file  Food Insecurity:   . Worried About Charity fundraiser in the Last Year: Not on file  . Ran Out of Food in the Last Year: Not on file  Transportation Needs:   . Lack of Transportation (Medical): Not on file  . Lack of Transportation (Non-Medical): Not on file  Physical Activity:   . Days of Exercise per Week: Not on file  . Minutes of Exercise per Session: Not on file  Stress:   . Feeling of Stress : Not on file  Social Connections:   . Frequency of  Communication with Friends and Family: Not on file  . Frequency of Social Gatherings with Friends and Family: Not on file  . Attends Religious Services: Not on file  . Active Member of Clubs or Organizations: Not on file  . Attends Archivist Meetings: Not on file  . Marital Status: Not on file  Intimate Partner Violence:   . Fear of Current or Ex-Partner: Not on file  . Emotionally Abused: Not on file  . Physically Abused: Not on file  . Sexually Abused: Not on file    Allergies:  No Known Allergies  Medications:  Current Outpatient Medications on File Prior to Visit  Medication Sig Dispense Refill  . amLODipine (NORVASC) 10 MG tablet TAKE 1 TABLET BY MOUTH ONCE DAILY    . benzonatate (TESSALON) 100 MG capsule Take 1 capsule (100 mg total) by mouth 3 (three) times  daily as needed for cough. 30 capsule 0  . Cholecalciferol (SM VITAMIN D3) 4000 UNITS CAPS Take 2 capsules by mouth daily.    Marland Kitchen ibuprofen (ADVIL,MOTRIN) 200 MG tablet Take 200 mg by mouth every 6 (six) hours as needed for pain.    Marland Kitchen ipratropium (ATROVENT) 0.06 % nasal spray Place 2 sprays into both nostrils 4 (four) times daily as needed (Congestion). 15 mL 0  . levothyroxine (SYNTHROID, LEVOTHROID) 125 MCG tablet TAKE 1 TABLET BY MOUTH ONCE DAILY ON AN EMPTY STOMACH  WITH A GLASS OF WATER 30-60 MINUTES BEFORE BREAKFAST    . Multiple Vitamins-Minerals (MULTIVITAMIN ADULT PO) Take by mouth.    . nortriptyline (PAMELOR) 10 MG capsule TAKE 2 CAPSULES BY MOUTH  NIGHTLY    . progesterone (PROMETRIUM) 200 MG capsule TAKE 1 CAPSULE BY MOUTH  DAILY ON DAYS 14-25 OF  CYCLE 36 capsule 0  . lisinopril-hydrochlorothiazide (PRINZIDE,ZESTORETIC) 10-12.5 MG tablet Take by mouth.     No current facility-administered medications on file prior to visit.   Physical Exam Vitals: BP 120/80   Temp (!) 96.2 F (35.7 C)   Ht 5\' 5"  (1.651 m)   Wt 273 lb (123.8 kg)   BMI 45.43 kg/m   General: Obese WF in NAD HEENT: normocephalic,  anicteric Neck: no thyroid enlargement, no palpable nodules, no cervical lymphadenopathy  Pulmonary: No increased work of breathing, CTAB Cardiovascular: RRR, without murmur  Breast: Breast symmetrical, no tenderness, no palpable nodules or masses, no skin or nipple retraction present, no nipple discharge.  No axillary, infraclavicular or supraclavicular lymphadenopathy. Abdomen: Soft, non-tender, obese, non-distended.  Umbilicus without lesions.  No hepatomegaly or masses palpable. No evidence of hernia. Genitourinary:  External: Normal external female genitalia.  Normal urethral meatus, normal Bartholin's and Skene's glands.    Vagina: Normal vaginal mucosa, no evidence of prolapse.    Cervix: Grossly normal in appearance, no bleeding, non-tender, pink, nullip  Uterus: Anteverted, irregular fundal area, enlarged, and non-tender  Adnexa: No adnexal masses, non-tender. Difficult exam due to body habitus.  Rectal: deferred  Lymphatic: no evidence of inguinal lymphadenopathy Extremities: no edema, erythema, or tenderness Neurologic: Grossly intact Psychiatric: mood appropriate, affect full     Assessment: 53 y.o. G0P0 annual gyn exam History of DUB, currently taking progesterone cyclically Family history of colon and either ovarian or uterine cancer Hx of fibroids, R/O growth of fibroids    Plan:   1) Breast cancer screening - recommend monthly self breast exam and annual screening mammograms. Mammogram is UTD.   2) Cervical cancer screening - Pap was not done. ASCCP guidelines and rational discussed.  Patient opts for every 3 years screening interval. Next due 2021  3) DUB: Continue cycling with Prometrium 200 mgm days 14-25 of each cycle.   4) Routine healthcare maintenance including cholesterol and diabetes screening managed by PCP.   5) Colonoscopy: next due 2024  6) Pelvic ultrasound ordered with follow up in the next 2-3 weeks.  Dalia Heading, CNM

## 2019-01-15 ENCOUNTER — Ambulatory Visit (INDEPENDENT_AMBULATORY_CARE_PROVIDER_SITE_OTHER): Payer: 59 | Admitting: Certified Nurse Midwife

## 2019-01-15 ENCOUNTER — Other Ambulatory Visit: Payer: Self-pay

## 2019-01-15 ENCOUNTER — Encounter: Payer: Self-pay | Admitting: Certified Nurse Midwife

## 2019-01-15 VITALS — BP 120/80 | Temp 96.2°F | Ht 65.0 in | Wt 273.0 lb

## 2019-01-15 DIAGNOSIS — Z8742 Personal history of other diseases of the female genital tract: Secondary | ICD-10-CM

## 2019-01-15 DIAGNOSIS — Z01419 Encounter for gynecological examination (general) (routine) without abnormal findings: Secondary | ICD-10-CM

## 2019-01-15 DIAGNOSIS — N926 Irregular menstruation, unspecified: Secondary | ICD-10-CM

## 2019-01-15 DIAGNOSIS — D259 Leiomyoma of uterus, unspecified: Secondary | ICD-10-CM

## 2019-02-05 ENCOUNTER — Other Ambulatory Visit: Payer: Self-pay

## 2019-02-05 ENCOUNTER — Ambulatory Visit (INDEPENDENT_AMBULATORY_CARE_PROVIDER_SITE_OTHER): Payer: 59 | Admitting: Certified Nurse Midwife

## 2019-02-05 ENCOUNTER — Encounter: Payer: Self-pay | Admitting: Certified Nurse Midwife

## 2019-02-05 ENCOUNTER — Ambulatory Visit (INDEPENDENT_AMBULATORY_CARE_PROVIDER_SITE_OTHER): Payer: 59

## 2019-02-05 VITALS — BP 126/84 | HR 94 | Ht 65.0 in | Wt 270.0 lb

## 2019-02-05 DIAGNOSIS — N8302 Follicular cyst of left ovary: Secondary | ICD-10-CM

## 2019-02-05 DIAGNOSIS — N83291 Other ovarian cyst, right side: Secondary | ICD-10-CM | POA: Diagnosis not present

## 2019-02-05 DIAGNOSIS — D252 Subserosal leiomyoma of uterus: Secondary | ICD-10-CM | POA: Diagnosis not present

## 2019-02-05 DIAGNOSIS — N83201 Unspecified ovarian cyst, right side: Secondary | ICD-10-CM | POA: Diagnosis not present

## 2019-02-05 DIAGNOSIS — D251 Intramural leiomyoma of uterus: Secondary | ICD-10-CM | POA: Diagnosis not present

## 2019-02-05 DIAGNOSIS — N926 Irregular menstruation, unspecified: Secondary | ICD-10-CM

## 2019-02-05 DIAGNOSIS — D259 Leiomyoma of uterus, unspecified: Secondary | ICD-10-CM

## 2019-02-17 NOTE — Progress Notes (Signed)
  [id number] Sarah Sharp is a 54 year old Caucasian/White female , G 0 , who presents for follow up after an ultrasound done  to check for growth of her fibroids. Her LMP was 5 Jan to 18 Jan.  An ultrasound 11/07/2014 revealed two fibroids: a posterior intramural fibroid measuring 50x39x41 mm and another anterior fibroid measuring 14x8x11 mm. There was a small paraovarian cyst on the right and a simple cyst on the left ovary 40x36x87mm in size.    Ultrasound today demonstrates: The uterus is retroverted and measures 7.4 x 5.1 x 6.9 cm. Echo texture is homogenous with evidence of focal masses. Within the uterus are multiple suspected fibroids measuring: Fibroid 1: 51.0 x 35.8 x 35.9 mm subserosal anterior Fibroid 2: 10.2 x 11.2 x 8.1 mm intramural posterior  The Endometrium measures 6.5 mm. Right Ovary measures 5.2 x 2.2 x 3.2 cm. There is a simple cyst in the right ovary measuring 37.5 x 29.7 x 18.5 mm There may be some microcalcifications in the right ovary.   Left Ovary measures 3.5 x 3.0 x 2.2 cm. There are two follicles seen measuring 20 x 14 x 17 mm and 13 x 10 x 19 mm.   Survey of the adnexa demonstrates no adnexal masses. PMHx: She  has a past medical history of Anxiety, Heart murmur, Hypercholesterolemia, Hypertension, Hypothyroidism, Mitral valve disorder, Morbid obesity ([place]), and Thyroid disease. Also,  has a past surgical history that includes Dilation and curettage of uterus (2007); Colonoscopy [id number], 2014; 07/2017); Wisdom tooth extraction; and Colonoscopy with propofol (N/A, 07/19/2017)., family history includes Anxiety disorder in her sister; Bipolar disorder in her sister; Bladder Cancer (age of onset: 18) in her maternal grandmother; Cancer in her paternal grandmother; Cancer (age of onset: 35) in her father; Colon cancer in an other family member; Colon polyps in her sister; Depression in her mother; Hypertension in her maternal grandfather and mother; Ovarian cancer in her  maternal grandmother; Thyroid disease in her paternal grandmother.,  reports that she has never smoked. She has never used smokeless tobacco. She reports current alcohol use. She reports that she does not use drugs.  She has a current medication list which includes the following prescription(s): amlodipine, cholecalciferol, ibuprofen, levothyroxine, multiple vitamin, nortriptyline, progesterone, benzonatate, ipratropium, and lisinopril-hydrochlorothiazide. Also, has No Known Allergies.  ROS  Objective: BP 126/84   Pulse 94   Ht 5\' 5"  (1.651 m)   Wt 270 lb (122.5 kg)   LMP 01/22/2019 (Exact Date)   BMI 44.93 kg/m   Physical examination Constitutional NAD, Conversant  Skin No rashes, lesions or ulceration.   Extremities: Moves all appropriately.  Normal ROM for age. No lymphadenopathy.  Neuro: Grossly intact  Psych: Oriented to PPT.  Normal mood. Normal affect.   Assessment:  Fibroids appear stable in size Simple cyst on the right ovary  Plan: Continue present plan of management with cyclical progesterone Continue menstrual calendar RTO in 1 year and prn.  Dalia Heading, CNM

## 2019-03-11 ENCOUNTER — Other Ambulatory Visit: Payer: Self-pay | Admitting: Certified Nurse Midwife

## 2020-01-23 ENCOUNTER — Ambulatory Visit (INDEPENDENT_AMBULATORY_CARE_PROVIDER_SITE_OTHER): Payer: 59 | Admitting: Advanced Practice Midwife

## 2020-01-23 ENCOUNTER — Other Ambulatory Visit: Payer: Self-pay

## 2020-01-23 ENCOUNTER — Encounter: Payer: Self-pay | Admitting: Advanced Practice Midwife

## 2020-01-23 VITALS — BP 95/68 | Ht 65.0 in | Wt 283.5 lb

## 2020-01-23 DIAGNOSIS — Z1239 Encounter for other screening for malignant neoplasm of breast: Secondary | ICD-10-CM | POA: Diagnosis not present

## 2020-01-23 DIAGNOSIS — N938 Other specified abnormal uterine and vaginal bleeding: Secondary | ICD-10-CM

## 2020-01-23 DIAGNOSIS — Z01419 Encounter for gynecological examination (general) (routine) without abnormal findings: Secondary | ICD-10-CM | POA: Diagnosis not present

## 2020-01-23 DIAGNOSIS — Z124 Encounter for screening for malignant neoplasm of cervix: Secondary | ICD-10-CM

## 2020-01-23 MED ORDER — PROGESTERONE 200 MG PO CAPS: ORAL_CAPSULE | ORAL | 3 refills | Status: DC

## 2020-01-23 NOTE — Patient Instructions (Signed)
Health Maintenance, Female Adopting a healthy lifestyle and getting preventive care are important in promoting health and wellness. Ask your health care provider about:  The right schedule for you to have regular tests and exams.  Things you can do on your own to prevent diseases and keep yourself healthy. What should I know about diet, weight, and exercise? Eat a healthy diet   Eat a diet that includes plenty of vegetables, fruits, low-fat dairy products, and lean protein.  Do not eat a lot of foods that are high in solid fats, added sugars, or sodium. Maintain a healthy weight Body mass index (BMI) is used to identify weight problems. It estimates body fat based on height and weight. Your health care provider can help determine your BMI and help you achieve or maintain a healthy weight. Get regular exercise Get regular exercise. This is one of the most important things you can do for your health. Most adults should:  Exercise for at least 150 minutes each week. The exercise should increase your heart rate and make you sweat (moderate-intensity exercise).  Do strengthening exercises at least twice a week. This is in addition to the moderate-intensity exercise.  Spend less time sitting. Even light physical activity can be beneficial. Watch cholesterol and blood lipids Have your blood tested for lipids and cholesterol at 53 years of age, then have this test every 5 years. Have your cholesterol levels checked more often if:  Your lipid or cholesterol levels are high.  You are older than 53 years of age.  You are at high risk for heart disease. What should I know about cancer screening? Depending on your health history and family history, you may need to have cancer screening at various ages. This may include screening for:  Breast cancer.  Cervical cancer.  Colorectal cancer.  Skin cancer.  Lung cancer. What should I know about heart disease, diabetes, and high blood  pressure? Blood pressure and heart disease  High blood pressure causes heart disease and increases the risk of stroke. This is more likely to develop in people who have high blood pressure readings, are of African descent, or are overweight.  Have your blood pressure checked: ? Every 3-5 years if you are 18-39 years of age. ? Every year if you are 40 years old or older. Diabetes Have regular diabetes screenings. This checks your fasting blood sugar level. Have the screening done:  Once every three years after age 40 if you are at a normal weight and have a low risk for diabetes.  More often and at a younger age if you are overweight or have a high risk for diabetes. What should I know about preventing infection? Hepatitis B If you have a higher risk for hepatitis B, you should be screened for this virus. Talk with your health care provider to find out if you are at risk for hepatitis B infection. Hepatitis C Testing is recommended for:  Everyone born from 1945 through 1965.  Anyone with known risk factors for hepatitis C. Sexually transmitted infections (STIs)  Get screened for STIs, including gonorrhea and chlamydia, if: ? You are sexually active and are younger than 53 years of age. ? You are older than 53 years of age and your health care provider tells you that you are at risk for this type of infection. ? Your sexual activity has changed since you were last screened, and you are at increased risk for chlamydia or gonorrhea. Ask your health care provider if   you are at risk.  Ask your health care provider about whether you are at high risk for HIV. Your health care provider may recommend a prescription medicine to help prevent HIV infection. If you choose to take medicine to prevent HIV, you should first get tested for HIV. You should then be tested every 3 months for as long as you are taking the medicine. Pregnancy  If you are about to stop having your period (premenopausal) and  you may become pregnant, seek counseling before you get pregnant.  Take 400 to 800 micrograms (mcg) of folic acid every day if you become pregnant.  Ask for birth control (contraception) if you want to prevent pregnancy. Osteoporosis and menopause Osteoporosis is a disease in which the bones lose minerals and strength with aging. This can result in bone fractures. If you are 65 years old or older, or if you are at risk for osteoporosis and fractures, ask your health care provider if you should:  Be screened for bone loss.  Take a calcium or vitamin D supplement to lower your risk of fractures.  Be given hormone replacement therapy (HRT) to treat symptoms of menopause. Follow these instructions at home: Lifestyle  Do not use any products that contain nicotine or tobacco, such as cigarettes, e-cigarettes, and chewing tobacco. If you need help quitting, ask your health care provider.  Do not use street drugs.  Do not share needles.  Ask your health care provider for help if you need support or information about quitting drugs. Alcohol use  Do not drink alcohol if: ? Your health care provider tells you not to drink. ? You are pregnant, may be pregnant, or are planning to become pregnant.  If you drink alcohol: ? Limit how much you use to 0-1 drink a day. ? Limit intake if you are breastfeeding.  Be aware of how much alcohol is in your drink. In the [place] one drink equals one 12 oz bottle of beer (355 mL), one 5 oz glass of wine (148 mL), or one 1 oz glass of hard liquor (44 mL). General instructions  Schedule regular health, dental, and eye exams.  Stay current with your vaccines.  Tell your health care provider if: ? You often feel depressed. ? You have ever been abused or do not feel safe at home. Summary  Adopting a healthy lifestyle and getting preventive care are important in promoting health and wellness.  Follow your health care provider's instructions about healthy  diet, exercising, and getting tested or screened for diseases.  Follow your health care provider's instructions on monitoring your cholesterol and blood pressure. This information is not intended to replace advice given to you by your health care provider. Make sure you discuss any questions you have with your health care provider. Document Revised: 12/27/2017 Document Reviewed: 12/27/2017 Elsevier Patient Education  2020 [employer]  

## 2020-01-23 NOTE — Progress Notes (Signed)
Gynecology Annual Exam  PCP: Baxter Hire, MD  Chief Complaint:  Chief Complaint  Patient presents with  . Gynecologic Exam    Annual - no concerns. RM 3    History of Present Illness:Patient is a 55 y.o. G0P0 presents for annual exam. The patient has no complaints today. She is due for a PAP smear. Mammogram will be ordered. Colonoscopy is due in 2024. She is still having irregular bleeding and denies heavy bleeding. She continues to take cyclical prometrium [id number] mg days 14-25 of cycle. In the past year she had 14 days of spotting/light bleeding in January, 2 days of spotting/1 day of normal flow in February, a few days of very light spotting in March, no bleeding in April or May, 5 days of spotting in June, no bleeding in July, 3 days of spotting in August, 3 days of light bleeding/2 days normal flow in September, no bleeding in October or November and 10 days of very light/spotting and 1 normal flow day in December.   LMP: No LMP recorded. Patient is perimenopausal.  Dysmenorrhea: no  The patient is not sexually active. She denies dyspareunia.  The patient does perform self breast exams.  There is possibly notable family history of breast or ovarian cancer in her family. Her maternal grandmother had either ovarian or uterine cancer. The patient is unsure.  The patient wears seatbelts: yes.   The patient has regular exercise: she does not have regular exercise. She admits a healthy diet and eating too much. She admits good hydration with H2O. She only gets about 6 hours of sleep per night.    The patient denies current symptoms of depression.     Review of Systems: Review of Systems  Constitutional: Negative for chills and fever.  HENT: Negative for congestion, ear discharge, ear pain, hearing loss, sinus pain and sore throat.   Eyes: Negative for blurred vision and double vision.  Respiratory: Negative for cough, shortness of breath and wheezing.   Cardiovascular: Negative  for chest pain, palpitations and leg swelling.  Gastrointestinal: Negative for abdominal pain, blood in stool, constipation, diarrhea, heartburn, melena, nausea and vomiting.  Genitourinary: Negative for dysuria, flank pain, frequency, hematuria and urgency.  Musculoskeletal: Negative for back pain, joint pain and myalgias.  Skin: Negative for itching and rash.  Neurological: Negative for dizziness, tingling, tremors, sensory change, speech change, focal weakness, seizures, loss of consciousness, weakness and headaches.  Endo/Heme/Allergies: Negative for environmental allergies. Does not bruise/bleed easily.  Psychiatric/Behavioral: Negative for depression, hallucinations, memory loss, substance abuse and suicidal ideas. The patient is not nervous/anxious and does not have insomnia.     Past Medical History:  Patient Active Problem List   Diagnosis Date Noted  . Uterine leiomyoma 12/09/2017  . History of dysfunctional uterine bleeding 12/09/2017  . Family history of ovarian cancer 12/05/2016  . Essential hypertension 12/05/2016  . Hypothyroidism 12/05/2016  . Anxiety and depression 12/05/2016  . Morbid obesity ([place]) 12/05/2016  . Family history of colon cancer 05/29/2012    Past Surgical History:  Past Surgical History:  Procedure Laterality Date  . COLONOSCOPY  2002,2009, 2014; 07/2017   hx of colon cancer in multiple 1st degree relatives. Findings, multiple small & large mouthed diverticula in sigmoid colon  . COLONOSCOPY WITH PROPOFOL N/A 07/19/2017   Procedure: COLONOSCOPY WITH PROPOFOL;  Surgeon: Robert Bellow, MD;  Location: [facility] ENDOSCOPY;  Service: Endoscopy;  Laterality: N/A;  . DILATION AND CURETTAGE OF UTERUS  2007  endometrial polyps  . WISDOM TOOTH EXTRACTION      Gynecologic History:  No LMP recorded. Patient is perimenopausal. Last Pap: 3 years ago Results were:  no abnormalities  Last mammogram: 1 year ago Results were: BI-RAD I  Obstetric History:  G0P0  Family History:  Family History  Problem Relation Age of Onset  . Cancer Father 2       colon cancer  . Colon polyps Sister   . Anxiety disorder Sister   . Hypertension Mother   . Depression Mother   . Bladder Cancer Maternal Grandmother 24  . Ovarian cancer Maternal Grandmother        vs uterine cancer had hysterectomy  . Hypertension Maternal Grandfather   . Cancer Paternal Grandmother        colon  . Thyroid disease Paternal Grandmother   . Bipolar disorder Sister   . Colon cancer Other   . Breast cancer Neg Hx     Social History:  Social History   Socioeconomic History  . Marital status: Single    Spouse name: Not on file  . Number of children: 0  . Years of education: 17  . Highest education level: Associate degree: academic program  Occupational History  . Occupation: Health and safety inspector  Tobacco Use  . Smoking status: Never Smoker  . Smokeless tobacco: Never Used  Vaping Use  . Vaping Use: Never used  Substance and Sexual Activity  . Alcohol use: Yes    Comment: rarely  . Drug use: No  . Sexual activity: Not Currently    Birth control/protection: None  Other Topics Concern  . Not on file  Social History Narrative  . Not on file   Social Determinants of Health   Financial Resource Strain: Not on file  Food Insecurity: Not on file  Transportation Needs: Not on file  Physical Activity: Not on file  Stress: Not on file  Social Connections: Not on file  Intimate Partner Violence: Not on file    Allergies:  No Known Allergies  Medications: Prior to Admission medications   Medication Sig Start Date End Date Taking? Authorizing Provider  amLODipine (NORVASC) 10 MG tablet TAKE 1 TABLET BY MOUTH ONCE DAILY 03/01/17  Yes [provider]  Cholecalciferol 100 MCG (4000 UT) CAPS Take 2 capsules by mouth daily.   Yes [provider]  levothyroxine (SYNTHROID, LEVOTHROID) 125 MCG tablet TAKE 1 TABLET BY MOUTH ONCE  DAILY ON AN EMPTY STOMACH  WITH A GLASS OF WATER 30-60 MINUTES BEFORE BREAKFAST 10/28/16  Yes [provider]  Multiple Vitamins-Minerals (MULTIVITAMIN ADULT PO) Take by mouth.   Yes [provider]  nortriptyline (PAMELOR) 10 MG capsule TAKE 2 CAPSULES BY MOUTH  NIGHTLY 11/23/16  Yes [provider]  progesterone (PROMETRIUM) 200 MG capsule Take 1 capsule by mouth daily on days 14 to 25 of cycle 01/23/20  Yes Tresea Mall, CNM  lisinopril-hydrochlorothiazide (PRINZIDE,ZESTORETIC) 10-12.5 MG tablet Take by mouth. 11/03/16 12/09/18  [provider]    Physical Exam Vitals: Blood pressure 95/68, height 5\' 5"  (1.651 m), weight 283 lb 8 oz (128.6 kg).  General: NAD HEENT: normocephalic, anicteric Thyroid: no enlargement, no palpable nodules Pulmonary: No increased work of breathing, CTAB Cardiovascular: RRR, distal pulses 2+ Breast: Breast symmetrical, no tenderness, no palpable nodules or masses, no skin or nipple retraction present, no nipple discharge.  No axillary or supraclavicular lymphadenopathy. Abdomen: NABS, soft, non-tender, non-distended.  Umbilicus without lesions.  No hepatomegaly, splenomegaly or masses palpable.  No evidence of hernia  Genitourinary:  External: Normal external female genitalia.  Normal urethral meatus, normal Bartholin's and Skene's glands.    Vagina: Normal vaginal mucosa, no evidence of prolapse.    Cervix: Grossly normal in appearance, friable, no CMT  Uterus: deferred    Adnexa: deferred  Rectal: deferred  Lymphatic: no evidence of inguinal lymphadenopathy Extremities: no edema, erythema, or tenderness Neurologic: Grossly intact Psychiatric: mood appropriate, affect full    Assessment: 55 y.o. G0P0 routine annual exam  Plan: Problem List Items Addressed This Visit   None   Visit Diagnoses    Well woman exam with routine gynecological exam    -  Primary   Relevant Orders   IGP, Aptima HPV   MM DIGITAL SCREENING  BILATERAL   Screening for cervical cancer       Relevant Orders   IGP, Aptima HPV   Dysfunctional uterine bleeding       Relevant Medications   progesterone (PROMETRIUM) 200 MG capsule   Breast screening       Relevant Orders   MM DIGITAL SCREENING BILATERAL      1) Mammogram - recommend yearly screening mammogram.  Mammogram Was ordered today  2) STI screening  was offered and declined  3) ASCCP guidelines and rationale discussed.  Patient opts for every 5 years screening interval after today's PAP smear  4) Osteoporosis  - per USPTF routine screening DEXA at age 37  Consider FDA-approved medical therapies in postmenopausal women and men aged 53 years and older, based on the following: a) A hip or vertebral (clinical or morphometric) fracture b) T-score ? -2.5 at the femoral neck or spine after appropriate evaluation to exclude secondary causes C) Low bone mass (T-score between -1.0 and -2.5 at the femoral neck or spine) and a 10-year probability of a hip fracture ? 3% or a 10-year probability of a major osteoporosis-related fracture ? 20% based on the [place]-adapted WHO algorithm   5) Routine healthcare maintenance including cholesterol, diabetes screening discussed managed by PCP  6) Colonoscopy is up to date and due in 2024.  Screening recommended starting at age 70 for average risk individuals, age 41 for individuals deemed at increased risk (including African Americans) and recommended to continue until age 25.  For patient age 19-85 individualized approach is recommended.  Gold standard screening is via colonoscopy, Cologuard screening is an acceptable alternative for patient unwilling or unable to undergo colonoscopy.  "Colorectal cancer screening for average?risk adults: 2018 guideline update from the American Cancer Society"CA: A Cancer Journal for Clinicians: Jun 15, 2016   7) Multiple moles: recommend dermatology screening  8) Increase healthy lifestyle; diet, exercise,  sleep  9) Return in about 1 year (around 01/22/2021) for annual established gyn.    Christean Leaf, CNM [facility] 01/23/20, 9:27 AM

## 2020-01-27 LAB — IGP, APTIMA HPV
HPV Aptima: NEGATIVE
PAP Smear Comment: 0

## 2020-03-03 ENCOUNTER — Other Ambulatory Visit: Payer: Self-pay

## 2020-03-03 ENCOUNTER — Ambulatory Visit
Admission: RE | Admit: 2020-03-03 | Discharge: 2020-03-03 | Disposition: A | Discharge status: Home / Self Care | Payer: 59 | Source: Ambulatory Visit | Attending: Advanced Practice Midwife | Admitting: Advanced Practice Midwife

## 2020-03-03 DIAGNOSIS — Z1231 Encounter for screening mammogram for malignant neoplasm of breast: Secondary | ICD-10-CM | POA: Insufficient documentation

## 2020-03-03 DIAGNOSIS — Z01419 Encounter for gynecological examination (general) (routine) without abnormal findings: Secondary | ICD-10-CM

## 2020-03-03 DIAGNOSIS — Z1239 Encounter for other screening for malignant neoplasm of breast: Secondary | ICD-10-CM

## 2020-05-15 ENCOUNTER — Ambulatory Visit
Admission: RE | Admit: 2020-05-15 | Discharge: 2020-05-15 | Disposition: A | Discharge status: Home / Self Care | Payer: 59 | Source: Ambulatory Visit | Attending: Sports Medicine | Admitting: Sports Medicine

## 2020-05-15 ENCOUNTER — Other Ambulatory Visit: Payer: Self-pay

## 2020-05-15 VITALS — BP 127/71 | HR 91 | Temp 98.7°F | Resp 18 | Ht 65.0 in | Wt 280.0 lb

## 2020-05-15 DIAGNOSIS — U071 COVID-19: Secondary | ICD-10-CM | POA: Diagnosis not present

## 2020-05-15 DIAGNOSIS — R059 Cough, unspecified: Secondary | ICD-10-CM

## 2020-05-15 DIAGNOSIS — B349 Viral infection, unspecified: Secondary | ICD-10-CM

## 2020-05-15 DIAGNOSIS — R0981 Nasal congestion: Secondary | ICD-10-CM | POA: Diagnosis present

## 2020-05-15 LAB — SARS CORONAVIRUS 2 (TAT 6-24 HRS): SARS Coronavirus 2: POSITIVE — AB

## 2020-05-15 MED ORDER — PROMETHAZINE-DM 6.25-15 MG/5ML PO SYRP: 5.0000 mL | ORAL_SOLUTION | Freq: Four times a day (QID) | ORAL | 0 refills | Status: DC | PRN

## 2020-05-15 NOTE — Discharge Instructions (Addendum)

## 2020-05-15 NOTE — ED Provider Notes (Signed)
[facility]    CSN: [id number] Arrival date & time: 05/15/20  1037      History   Chief Complaint Chief Complaint  Patient presents with  . Cough  . Nasal Congestion    HPI Sarah Sharp is a 55 y.o. female presenting for 3-day history of fever up to 101 degrees, fatigue, cough, sore throat, nasal congestion.  Patient says that the cough is mostly dry.  She says it does keep her up at night.  Has been taking Coricidin HBP for this without relief.  She also admits to a little bit of chest tightness but states it has improved over the past 2 days.  She denies any breathing difficulty.  Has not had any abdominal pain, nausea/vomiting or diarrhea.  Patient states that she did take 3 at home COVID testing were all positive.  She denies any sick contacts and no known exposure to COVID-19.  She is vaccinated for COVID-19 x2.  Her medical history significant for hypertension, hyperlipidemia, obesity, thyroid disease, and anxiety.  Patient states that she is most bothered by her cough.  She has no other complaints or concerns today.  HPI  Past Medical History:  Diagnosis Date  . Anxiety   . Heart murmur   . Hypercholesterolemia   . Hypertension   . Hypothyroidism   . Mitral valve disorder   . Morbid obesity ([place])   . Thyroid disease     Patient Active Problem List   Diagnosis Date Noted  . Uterine leiomyoma 12/09/2017  . History of dysfunctional uterine bleeding 12/09/2017  . Family history of ovarian cancer 12/05/2016  . Essential hypertension 12/05/2016  . Hypothyroidism 12/05/2016  . Anxiety and depression 12/05/2016  . Morbid obesity ([place]) 12/05/2016  . Family history of colon cancer 05/29/2012    Past Surgical History:  Procedure Laterality Date  . COLONOSCOPY  2002,2009, 2014; 07/2017   hx of colon cancer in multiple 1st degree relatives. Findings, multiple small & large mouthed diverticula in sigmoid colon  . COLONOSCOPY WITH PROPOFOL N/A 07/19/2017    Procedure: COLONOSCOPY WITH PROPOFOL;  Surgeon: Robert Bellow, MD;  Location: [facility] ENDOSCOPY;  Service: Endoscopy;  Laterality: N/A;  . DILATION AND CURETTAGE OF UTERUS  2007   endometrial polyps  . WISDOM TOOTH EXTRACTION      OB History    Gravida  0   Para      Term      Preterm      AB      Living        SAB      IAB      Ectopic      Multiple      Live Births           Obstetric Comments  Age first period 74         Home Medications    Prior to Admission medications   Medication Sig Start Date End Date Taking? Authorizing Provider  amLODipine (NORVASC) 10 MG tablet TAKE 1 TABLET BY MOUTH ONCE DAILY 03/01/17  Yes [provider]  Cholecalciferol 100 MCG (4000 UT) CAPS Take 2 capsules by mouth daily.   Yes [provider]  levothyroxine (SYNTHROID, LEVOTHROID) 125 MCG tablet TAKE 1 TABLET BY MOUTH ONCE DAILY ON AN EMPTY STOMACH  WITH A GLASS OF WATER 30-60 MINUTES BEFORE BREAKFAST 10/28/16  Yes [provider]  lisinopril-hydrochlorothiazide (PRINZIDE,ZESTORETIC) 10-12.5 MG tablet Take by mouth. 11/03/16 05/15/20 Yes [provider]  Multiple Vitamins-Minerals (MULTIVITAMIN ADULT PO) Take by mouth.   Yes [provider]  nortriptyline (PAMELOR) 10 MG capsule TAKE 2 CAPSULES BY MOUTH  NIGHTLY 11/23/16  Yes [provider]  progesterone (PROMETRIUM) 200 MG capsule Take 1 capsule by mouth daily on days 14 to 25 of cycle 01/23/20  Yes Rod Can, CNM  promethazine-dextromethorphan (PROMETHAZINE-DM) 6.25-15 MG/5ML syrup Take 5 mLs by mouth 4 (four) times daily as needed for cough. 05/15/20  Yes Danton Clap, PA-C    Family History Family History  Problem Relation Age of Onset  . Cancer Father 2       colon cancer  . Colon polyps Sister   . Anxiety disorder Sister   . Hypertension Mother   . Depression Mother   . Bladder Cancer Maternal Grandmother 93  . Ovarian cancer Maternal Grandmother         vs uterine cancer had hysterectomy  . Hypertension Maternal Grandfather   . Cancer Paternal Grandmother        colon  . Thyroid disease Paternal Grandmother   . Bipolar disorder Sister   . Colon cancer Other   . Breast cancer Neg Hx     Social History Social History   Tobacco Use  . Smoking status: Never Smoker  . Smokeless tobacco: Never Used  Vaping Use  . Vaping Use: Never used  Substance Use Topics  . Alcohol use: Yes    Comment: rarely  . Drug use: No     Allergies   Patient has no known allergies.   Review of Systems Review of Systems  Constitutional: Positive for fatigue and fever. Negative for chills and diaphoresis.  HENT: Positive for congestion, rhinorrhea and sore throat. Negative for ear pain, sinus pressure and sinus pain.   Respiratory: Positive for cough and chest tightness. Negative for shortness of breath.   Gastrointestinal: Negative for abdominal pain, nausea and vomiting.  Musculoskeletal: Negative for arthralgias and myalgias.  Skin: Negative for rash.  Neurological: Negative for weakness and headaches.  Hematological: Negative for adenopathy.     Physical Exam Triage Vital Signs ED Triage Vitals  Enc Vitals Group     BP 05/15/20 1054 127/71     Pulse Rate 05/15/20 1054 91     Resp 05/15/20 1054 18     Temp 05/15/20 1054 98.7 F (37.1 C)     Temp Source 05/15/20 1054 Oral     SpO2 05/15/20 1054 100 %     Weight 05/15/20 1052 280 lb (127 kg)     Height 05/15/20 1052 5\' 5"  (1.651 m)     Head Circumference --      Peak Flow --      Pain Score 05/15/20 1052 4     Pain Loc --      Pain Edu? --      Excl. in [place]? --    No data found.  Updated Vital Signs BP 127/71 (BP Location: Left Arm)   Pulse 91   Temp 98.7 F (37.1 C) (Oral)   Resp 18   Ht 5\' 5"  (1.651 m)   Wt 280 lb (127 kg)   SpO2 100%   BMI 46.59 kg/m       Physical Exam Vitals and nursing note reviewed.  Constitutional:      General: She is not in acute  distress.    Appearance: Normal appearance. She is not ill-appearing or toxic-appearing.  HENT:     Head: Normocephalic and atraumatic.  Nose: Congestion and rhinorrhea present.     Mouth/Throat:     Mouth: Mucous membranes are moist.     Pharynx: Oropharynx is clear. Posterior oropharyngeal erythema present.  Eyes:     General: No scleral icterus.       Right eye: No discharge.        Left eye: No discharge.     Conjunctiva/sclera: Conjunctivae normal.  Cardiovascular:     Rate and Rhythm: Normal rate and regular rhythm.     Heart sounds: Normal heart sounds.  Pulmonary:     Effort: Pulmonary effort is normal. No respiratory distress.     Breath sounds: Normal breath sounds. No wheezing, rhonchi or rales.  Musculoskeletal:     Cervical back: Neck supple.  Skin:    General: Skin is dry.  Neurological:     General: No focal deficit present.     Mental Status: She is alert. Mental status is at baseline.     Motor: No weakness.     Gait: Gait normal.  Psychiatric:        Mood and Affect: Mood normal.        Behavior: Behavior normal.        Thought Content: Thought content normal.      UC Treatments / Results  Labs (all labs ordered are listed, but only abnormal results are displayed) Labs Reviewed  SARS CORONAVIRUS 2 (TAT 6-24 HRS)    EKG   Radiology No results found.  Procedures Procedures (including critical care time)  Medications Ordered in UC Medications - No data to display  Initial Impression / Assessment and Plan / UC Course  I have reviewed the triage vital signs and the nursing notes.  Pertinent labs & imaging results that were available during my care of the patient were reviewed by me and considered in my medical decision making (see chart for details).   55 year old female presenting for cough, congestion, sore throat x3 days.  Has had positive at home COVID test.  All vital signs are stable in the clinic.  She is afebrile.  Oxygen is 100%.   Exam is significant for nasal congestion and clearish rhinorrhea.  Mild posterior pharyngeal erythema.  Her chest is clear to auscultation and heart regular rate and rhythm.  PCR COVID test obtained.  Current CDC guidelines, isolation protocol and ED precautions reviewed with patient.  I did discuss referring her to the [place] clinic if her test is positive.  She says that she is not really interested in antibody or antiviral therapy so I did not obtain a creatinine on her today.  Patient states that her symptoms are actually little better than at onset and she would just prefer something for the cough.  I sent Promethazine DM and encouraged her to increase rest and fluids.  Advised her to follow-up with her clinic as needed for any worsening symptoms or she is not better after 7 to 10 days.  She declined a work note stating that she is fine to work from home.  Final Clinical Impressions(s) / UC Diagnoses   Final diagnoses:  Viral illness  Cough  Nasal congestion     Discharge Instructions     You have received COVID testing today either for positive exposure, concerning symptoms that could be related to COVID infection, screening purposes, or re-testing after confirmed positive.  Your test obtained today checks for active viral infection in the last 1-2 weeks. If your test is negative now, you can still  test positive later. So, if you do develop symptoms you should either get re-tested and/or isolate x 5 days and then strict mask use x 5 days (unvaccinated) or mask use x 10 days (vaccinated). Please follow CDC guidelines.  While Rapid antigen tests come back in 15-20 minutes, send out PCR/molecular test results typically come back within 1-3 days. In the mean time, if you are symptomatic, assume this could be a positive test and treat/monitor yourself as if you do have COVID.   We will call with test results if positive. Please download the MyChart app and set up a profile to access test  results.   If symptomatic, go home and rest. Push fluids. Take Tylenol as needed for discomfort. Gargle warm salt water. Throat lozenges. Take Mucinex DM or Robitussin for cough. Humidifier in bedroom to ease coughing. Warm showers. Also review the COVID handout for more information.  COVID-19 INFECTION: The incubation period of COVID-19 is approximately 14 days after exposure, with most symptoms developing in roughly 4-5 days. Symptoms may range in severity from mild to critically severe. Roughly 80% of those infected will have mild symptoms. People of any age may become infected with COVID-19 and have the ability to transmit the virus. The most common symptoms include: fever, fatigue, cough, body aches, headaches, sore throat, nasal congestion, shortness of breath, nausea, vomiting, diarrhea, changes in smell and/or taste.    COURSE OF ILLNESS Some patients may begin with mild disease which can progress quickly into critical symptoms. If your symptoms are worsening please call ahead to the Emergency Department and proceed there for further treatment. Recovery time appears to be roughly 1-2 weeks for mild symptoms and 3-6 weeks for severe disease.   GO IMMEDIATELY TO ER FOR FEVER YOU ARE UNABLE TO GET DOWN WITH TYLENOL, BREATHING PROBLEMS, CHEST PAIN, FATIGUE, LETHARGY, INABILITY TO EAT OR DRINK, ETC  QUARANTINE AND ISOLATION: To help decrease the spread of COVID-19 please remain isolated if you have COVID infection or are highly suspected to have COVID infection. This means -stay home and isolate to one room in the home if you live with others. Do not share a bed or bathroom with others while ill, sanitize and wipe down all countertops and keep common areas clean and disinfected. Stay home for 5 days. If you have no symptoms or your symptoms are resolving after 5 days, you can leave your house. Continue to wear a mask around others for 5 additional days. If you have been in close contact (within 6  feet) of someone diagnosed with COVID 19, you are advised to quarantine in your home for 14 days as symptoms can develop anywhere from 2-14 days after exposure to the virus. If you develop symptoms, you  must isolate.  Most current guidelines for COVID after exposure -unvaccinated: isolate 5 days and strict mask use x 5 days. Test on day 5 is possible -vaccinated: wear mask x 10 days if symptoms do not develop -You do not necessarily need to be tested for COVID if you have + exposure and  develop symptoms. Just isolate at home x10 days from symptom onset During this global pandemic, CDC advises to practice social distancing, try to stay at least 41ft away from others at all times. Wear a face covering. Wash and sanitize your hands regularly and avoid going anywhere that is not necessary.  KEEP IN MIND THAT THE COVID TEST IS NOT 100% ACCURATE AND YOU SHOULD STILL DO EVERYTHING TO PREVENT POTENTIAL SPREAD OF VIRUS  TO OTHERS (WEAR MASK, WEAR GLOVES, [place] HANDS AND SANITIZE REGULARLY). IF INITIAL TEST IS NEGATIVE, THIS MAY NOT MEAN YOU ARE DEFINITELY NEGATIVE. MOST ACCURATE TESTING IS DONE 5-7 DAYS AFTER EXPOSURE.   It is not advised by CDC to get re-tested after receiving a positive COVID test since you can still test positive for weeks to months after you have already cleared the virus.   *If you have not been vaccinated for COVID, I strongly suggest you consider getting vaccinated as long as there are no contraindications.      ED Prescriptions    Medication Sig Dispense Auth. Provider   promethazine-dextromethorphan (PROMETHAZINE-DM) 6.25-15 MG/5ML syrup Take 5 mLs by mouth 4 (four) times daily as needed for cough. 118 mL Danton Clap, PA-C     PDMP not reviewed this encounter.   Danton Clap, PA-C 05/15/20 1136

## 2020-05-15 NOTE — ED Triage Notes (Signed)
Pt c/o cough, nasal congestion, sore throat, fever since Tuesday. Pt states she took an at-home COVID test x3 and they are all positive.

## 2020-09-08 ENCOUNTER — Other Ambulatory Visit: Payer: Self-pay | Admitting: Advanced Practice Midwife

## 2020-09-08 DIAGNOSIS — N938 Other specified abnormal uterine and vaginal bleeding: Secondary | ICD-10-CM

## 2020-11-06 ENCOUNTER — Other Ambulatory Visit: Payer: Self-pay | Admitting: Advanced Practice Midwife

## 2020-11-06 DIAGNOSIS — N938 Other specified abnormal uterine and vaginal bleeding: Secondary | ICD-10-CM

## 2020-12-28 ENCOUNTER — Ambulatory Visit: Payer: Self-pay | Admitting: Internal Medicine

## 2021-03-10 ENCOUNTER — Other Ambulatory Visit: Payer: Self-pay | Admitting: Advanced Practice Midwife

## 2021-03-10 DIAGNOSIS — Z1231 Encounter for screening mammogram for malignant neoplasm of breast: Secondary | ICD-10-CM

## 2021-03-17 ENCOUNTER — Ambulatory Visit (INDEPENDENT_AMBULATORY_CARE_PROVIDER_SITE_OTHER): Payer: 59 | Admitting: Advanced Practice Midwife

## 2021-03-17 ENCOUNTER — Encounter: Payer: Self-pay | Admitting: Advanced Practice Midwife

## 2021-03-17 ENCOUNTER — Other Ambulatory Visit: Payer: Self-pay

## 2021-03-17 VITALS — BP 107/69 | Ht 65.0 in | Wt 281.0 lb

## 2021-03-17 DIAGNOSIS — N938 Other specified abnormal uterine and vaginal bleeding: Secondary | ICD-10-CM | POA: Diagnosis not present

## 2021-03-17 DIAGNOSIS — Z0000 Encounter for general adult medical examination without abnormal findings: Secondary | ICD-10-CM

## 2021-03-17 MED ORDER — PROGESTERONE 200 MG PO CAPS: ORAL_CAPSULE | ORAL | 3 refills | Status: DC

## 2021-03-17 NOTE — Progress Notes (Signed)
Gynecology Annual Exam  PCP: Baxter Hire, MD  Chief Complaint:  Chief Complaint  Patient presents with   Gynecologic Exam    No concerns    History of Present Illness:Patient is a 56 y.o. G0P0 presents for annual exam. The patient has no gyn complaints today. She continues to take prometrium.  LMP: Patient's last menstrual period was 02/07/2021 (exact date). Periods are irregular, of light to spotting flow and occurred as follows in the past year: January: 10-22, 28-February 10 March: 25-26 April, May, June: no bleeding July: 13-18 August, September, October, November: no bleeding December: 6-17 January: 5-15, 22-February 3   The patient is not sexually active. She denies dyspareunia.  The patient does perform self breast exams.  There is uncertain notable family history of breast or ovarian cancer in her family. Please see note from 01/23/20.  The patient wears seatbelts: yes.   The patient has regular exercise: not much currently. She started Wellbutrin in January which has helped her to be more motivated with healthy lifestyle. Her appetite is decreased and she does focus on healthy foods. She admits adequate hydration and sleep.    The patient denies current symptoms of depression.     Review of Systems: Review of Systems  Constitutional:  Negative for chills and fever.  HENT:  Negative for congestion, ear discharge, ear pain, hearing loss, sinus pain and sore throat.   Eyes:  Negative for blurred vision and double vision.  Respiratory:  Negative for cough, shortness of breath and wheezing.   Cardiovascular:  Negative for chest pain, palpitations and leg swelling.  Gastrointestinal:  Negative for abdominal pain, blood in stool, constipation, diarrhea, heartburn, melena, nausea and vomiting.  Genitourinary:  Negative for dysuria, flank pain, frequency, hematuria and urgency.  Musculoskeletal:  Negative for back pain, joint pain and myalgias.  Skin:  Negative for  itching and rash.  Neurological:  Negative for dizziness, tingling, tremors, sensory change, speech change, focal weakness, seizures, loss of consciousness, weakness and headaches.  Endo/Heme/Allergies:  Negative for environmental allergies. Does not bruise/bleed easily.  Psychiatric/Behavioral:  Negative for depression, hallucinations, memory loss, substance abuse and suicidal ideas. The patient is not nervous/anxious and does not have insomnia.    Past Medical History:  Patient Active Problem List   Diagnosis Date Noted   Morbid obesity with BMI of 45.0-49.9, adult ([place]) 03/09/2020   Uterine leiomyoma 12/09/2017   History of dysfunctional uterine bleeding 12/09/2017   Family history of ovarian cancer 12/05/2016   Essential hypertension 12/05/2016   Hypothyroidism 12/05/2016   Anxiety and depression 12/05/2016   Family history of colon cancer 05/29/2012    Past Surgical History:  Past Surgical History:  Procedure Laterality Date   COLONOSCOPY  2002,2009, 2014; 07/2017   hx of colon cancer in multiple 1st degree relatives. Findings, multiple small & large mouthed diverticula in sigmoid colon   COLONOSCOPY WITH PROPOFOL N/A 07/19/2017   Procedure: COLONOSCOPY WITH PROPOFOL;  Surgeon: Robert Bellow, MD;  Location: [facility] ENDOSCOPY;  Service: Endoscopy;  Laterality: N/A;   DILATION AND CURETTAGE OF UTERUS  2007   endometrial polyps   WISDOM TOOTH EXTRACTION      Gynecologic History:  Patient's last menstrual period was 02/07/2021 (exact date). Last Pap: 01/23/20 Results were:  no abnormalities  Last mammogram: 03/03/20 Results were: BI-RAD I  Obstetric History: G0P0  Family History:  Family History  Problem Relation Age of Onset   Cancer Father 22  colon cancer   Colon polyps Sister    Anxiety disorder Sister    Hypertension Mother    Depression Mother    Bladder Cancer Maternal Grandmother 93   Ovarian cancer Maternal Grandmother        vs uterine cancer had  hysterectomy   Hypertension Maternal Grandfather    Cancer Paternal Grandmother        colon   Thyroid disease Paternal Grandmother    Bipolar disorder Sister    Colon cancer Other    Breast cancer Neg Hx     Social History:  Social History   Socioeconomic History   Marital status: Single    Spouse name: Not on file   Number of children: 0   Years of education: 14   Highest education level: Associate degree: academic program  Occupational History   Occupation: Wellsite geologist  Tobacco Use   Smoking status: Never   Smokeless tobacco: Never  Vaping Use   Vaping Use: Never used  Substance and Sexual Activity   Alcohol use: Yes    Comment: rarely   Drug use: No   Sexual activity: Not Currently    Birth control/protection: None  Other Topics Concern   Not on file  Social History Narrative   Not on file   Social Determinants of Health   Financial Resource Strain: Not on file  Food Insecurity: Not on file  Transportation Needs: Not on file  Physical Activity: Not on file  Stress: Not on file  Social Connections: Not on file  Intimate Partner Violence: Not on file    Allergies:  No Known Allergies  Medications: Prior to Admission medications   Medication Sig Start Date End Date Taking? Authorizing Provider  amLODipine (NORVASC) 10 MG tablet TAKE 1 TABLET BY MOUTH ONCE DAILY 03/01/17  Yes [provider]  buPROPion (WELLBUTRIN XL) 150 MG 24 hr tablet Take 150 mg by mouth every morning. 03/10/21  Yes [provider]  Cholecalciferol 100 MCG (4000 UT) CAPS Take 2 capsules by mouth daily.   Yes [provider]  levothyroxine (SYNTHROID, LEVOTHROID) 125 MCG tablet TAKE 1 TABLET BY MOUTH ONCE DAILY ON AN EMPTY STOMACH  WITH A GLASS OF WATER 30-60 MINUTES BEFORE BREAKFAST 10/28/16  Yes [provider]  Multiple Vitamins-Minerals (MULTIVITAMIN ADULT PO) Take by mouth.   Yes [provider]  nortriptyline  (PAMELOR) 10 MG capsule TAKE 2 CAPSULES BY MOUTH  NIGHTLY 11/23/16  Yes [provider]  lisinopril-hydrochlorothiazide (PRINZIDE,ZESTORETIC) 10-12.5 MG tablet Take by mouth. 11/03/16 05/15/20  [provider]  progesterone (PROMETRIUM) 200 MG capsule Take 1 capsule by mouth daily on days 14 to 25 of cycle 03/17/21   Rod Can, CNM    Physical Exam Vitals: Blood pressure 107/69, height 5\' 5"  (1.651 m), weight 281 lb (127.5 kg), last menstrual period 02/07/2021.  General: NAD HEENT: normocephalic, anicteric Thyroid: no enlargement, no palpable nodules Pulmonary: No increased work of breathing, CTAB Cardiovascular: RRR, distal pulses 2+ Breast: Breast symmetrical, no tenderness, no palpable nodules or masses, no skin or nipple retraction present, no nipple discharge.  No axillary or supraclavicular lymphadenopathy. Abdomen: NABS, soft, non-tender, non-distended.  Umbilicus without lesions.  No hepatomegaly, splenomegaly or masses palpable. No evidence of hernia  Genitourinary: deferred for no concerns/PAP interval Extremities: no edema, erythema, or tenderness Neurologic: Grossly intact Psychiatric: mood appropriate, affect full    Assessment: 56 y.o. G0P0 routine annual exam  Plan: Problem List Items Addressed This Visit   None  Visit Diagnoses     Well woman exam without gynecological exam    -  Primary   Dysfunctional uterine bleeding       Relevant Medications   progesterone (PROMETRIUM) 200 MG capsule       1) Mammogram - recommend yearly screening mammogram.  Mammogram  is scheduled for 04/29/21  2) STI screening  was offered and declined  3) ASCCP guidelines and rationale discussed.  Patient opts for every 5 years screening interval. Next PAP due in 2027.  4) Osteoporosis  - per USPTF routine screening DEXA at age 71  Consider FDA-approved medical therapies in postmenopausal women and men aged 51 years and older, based on the following: a) A hip or  vertebral (clinical or morphometric) fracture b) T-score ? -2.5 at the femoral neck or spine after appropriate evaluation to exclude secondary causes C) Low bone mass (T-score between -1.0 and -2.5 at the femoral neck or spine) and a 10-year probability of a hip fracture ? 3% or a 10-year probability of a major osteoporosis-related fracture ? 20% based on the [place]-adapted WHO algorithm   5) Routine healthcare maintenance including cholesterol, diabetes screening discussed managed by PCP  6) Colonoscopy up to date and due in 2024.  Screening recommended starting at age 48 for average risk individuals, age 31 for individuals deemed at increased risk (including African Americans) and recommended to continue until age 94.  For patient age 56-85 individualized approach is recommended.  Gold standard screening is via colonoscopy, Cologuard screening is an acceptable alternative for patient unwilling or unable to undergo colonoscopy.  "Colorectal cancer screening for average?risk adults: 2018 guideline update from the American Cancer Society"CA: A Cancer Journal for Clinicians: Jun 15, 2016   7) Continue with healthy lifestyles changes, increase physical activity  8) Return in about 1 year (around 03/18/2022) for annual established gyn.   Christean Leaf, CNM [facility] 03/17/21, 2:08 PM

## 2021-04-29 ENCOUNTER — Ambulatory Visit
Admission: RE | Admit: 2021-04-29 | Discharge: 2021-04-29 | Disposition: A | Discharge status: Home / Self Care | Payer: 59 | Source: Ambulatory Visit | Attending: Advanced Practice Midwife | Admitting: Advanced Practice Midwife

## 2021-04-29 DIAGNOSIS — Z1231 Encounter for screening mammogram for malignant neoplasm of breast: Secondary | ICD-10-CM | POA: Insufficient documentation

## 2021-07-18 ENCOUNTER — Encounter: Payer: Self-pay | Admitting: Emergency Medicine

## 2021-07-18 ENCOUNTER — Ambulatory Visit
Admission: EM | Admit: 2021-07-18 | Discharge: 2021-07-18 | Disposition: A | Discharge status: Home / Self Care | Payer: 59 | Attending: Emergency Medicine | Admitting: Emergency Medicine

## 2021-07-18 DIAGNOSIS — K122 Cellulitis and abscess of mouth: Secondary | ICD-10-CM | POA: Diagnosis present

## 2021-07-18 LAB — GROUP A STREP BY PCR: Group A Strep by PCR: NOT DETECTED

## 2021-07-18 MED ORDER — PREDNISONE 10 MG (21) PO TBPK: ORAL_TABLET | ORAL | 0 refills | Status: DC

## 2021-07-18 NOTE — ED Triage Notes (Addendum)
Patient c/o swollen tonsils and uvula that started yesterday morning.  Patient reports some discomfort in her throat.  Patient denies fevers.  Patient states that she normally takes an allergy medicine but started a new allergy medicine a week ago called Aller-Essentials.

## 2021-07-18 NOTE — ED Provider Notes (Signed)
HPI  SUBJECTIVE:  Sarah Sharp is a 56 y.o. female who presents with sore throat and swollen uvula starting yesterday.  Not sure if her tonsils are swollen.  She started a new homeopathic allergy medication 5 to 6 days ago because the Allegra was working no longer, and was okay up until yesterday.  She states that her allergies have been bothering her with sneezing, rhinorrhea, nasal congestion, postnasal drip.  She states that she has had symptoms like this before for 5 time, is thought to be due to allergies, and responds well to steroids.  She has sought medical attention 3 times for this previously.  No fevers, difficulty breathing, difficulty swallowing, voice changes, neck stiffness, drooling, trismus.  She denies trauma to the uvula.  No lip or tongue swelling, wheezing, shortness of breath, nausea, vomiting, diarrhea, abdominal pain, syncope.  She tried drinking ice water with improvement in her symptoms.  Symptoms worse with lying down.  She has a past medical history of allergies, uvular swelling, hypothyroidism, depression, hypertension.  She is on lisinopril, but has been on this for "years" and has never had a problem with that.  No history of angioedema.  Family history negative for angioedema.  [facility]   Past Medical History:  Diagnosis Date   Anxiety    Heart murmur    Hypercholesterolemia    Hypertension    Hypothyroidism    Mitral valve disorder    Morbid obesity ([place])    Thyroid disease     Past Surgical History:  Procedure Laterality Date   COLONOSCOPY  2002,2009, 2014; 07/2017   hx of colon cancer in multiple 1st degree relatives. Findings, multiple small & large mouthed diverticula in sigmoid colon   COLONOSCOPY WITH PROPOFOL N/A 07/19/2017   Procedure: COLONOSCOPY WITH PROPOFOL;  Surgeon: Robert Bellow, MD;  Location: [facility] ENDOSCOPY;  Service: Endoscopy;  Laterality: N/A;   DILATION AND CURETTAGE OF UTERUS  2007   endometrial polyps   WISDOM TOOTH  EXTRACTION      Family History  Problem Relation Age of Onset   Cancer Father 108       colon cancer   Colon polyps Sister    Anxiety disorder Sister    Hypertension Mother    Depression Mother    Bladder Cancer Maternal Grandmother 93   Ovarian cancer Maternal Grandmother        vs uterine cancer had hysterectomy   Hypertension Maternal Grandfather    Cancer Paternal Grandmother        colon   Thyroid disease Paternal Grandmother    Bipolar disorder Sister    Colon cancer Other    Breast cancer Neg Hx     Social History   Tobacco Use   Smoking status: Never   Smokeless tobacco: Never  Vaping Use   Vaping Use: Never used  Substance Use Topics   Alcohol use: Yes    Comment: rarely   Drug use: No    No current facility-administered medications for this encounter.  Current Outpatient Medications:    amLODipine (NORVASC) 10 MG tablet, TAKE 1 TABLET BY MOUTH ONCE DAILY, Disp: , Rfl:    buPROPion (WELLBUTRIN XL) 150 MG 24 hr tablet, Take 150 mg by mouth every morning., Disp: , Rfl:    Cholecalciferol 100 MCG (4000 UT) CAPS, Take 2 capsules by mouth daily., Disp: , Rfl:    levothyroxine (SYNTHROID, LEVOTHROID) 125 MCG tablet, TAKE 1 TABLET BY MOUTH ONCE DAILY ON AN EMPTY STOMACH  WITH A GLASS OF WATER 30-60 MINUTES BEFORE BREAKFAST, Disp: , Rfl:    lisinopril-hydrochlorothiazide (PRINZIDE,ZESTORETIC) 10-12.5 MG tablet, Take by mouth., Disp: , Rfl:    Multiple Vitamins-Minerals (MULTIVITAMIN ADULT PO), Take by mouth., Disp: , Rfl:    nortriptyline (PAMELOR) 10 MG capsule, TAKE 2 CAPSULES BY MOUTH  NIGHTLY, Disp: , Rfl:    predniSONE (STERAPRED UNI-PAK 21 TAB) 10 MG (21) TBPK tablet, Dispense one 6 day pack. Take as directed with food., Disp: 21 tablet, Rfl: 0   progesterone (PROMETRIUM) 200 MG capsule, Take 1 capsule by mouth daily on days 14 to 25 of cycle, Disp: 36 capsule, Rfl: 3  No Known Allergies   ROS  As noted in HPI.   Physical Exam  BP (!) 145/84 (BP  Location: Left Arm)   Pulse (!) 109   Temp 98.2 F (36.8 C) (Oral)   Resp 14   Ht '5\' 5"'$  (1.651 m)   Wt 123.4 kg   SpO2 99%   BMI 45.26 kg/m   Constitutional: Well developed, well nourished, no acute distress Eyes:  EOMI, conjunctiva normal bilaterally HENT: Normocephalic, atraumatic,mucus membranes moist.  Tonsils normal size without exudates.  Erythematous, swollen uvula.  No soft palate swelling.  Uvula midline.  No muffled voice, drooling, trismus Neck: No cervical lymphadenopathy Respiratory: Normal inspiratory effort, lungs clear bilaterally Cardiovascular: Mild tachycardia GI: nondistended skin: No rash, skin intact Musculoskeletal: no deformities Neurologic: Alert & oriented x 3, no focal neuro deficits Psychiatric: Speech and behavior appropriate   ED Course   Medications - No data to display  Orders Placed This Encounter  Procedures   Group A Strep by PCR    Standing Status:   Standing    Number of Occurrences:   1    Results for orders placed or performed during the hospital encounter of 07/18/21 (from the past 24 hour(s))  Group A Strep by PCR     Status: None   Collection Time: 07/18/21  8:30 AM   Specimen: Throat; Sterile Swab  Result Value Ref Range   Group A Strep by PCR NOT DETECTED NOT DETECTED   No results found.  ED Clinical Impression  1. Uvulitis      ED Assessment/Plan  Patient with uvular swelling, suspect from allergies.  Will call patient at [phone] if strep is positive..  Will send home with a 6-day prednisone taper as patient states that she has responded well to this.  Discussed with patient that this could be due to her lisinopril, and to talk to her doctor about this.  ER return precautions given.  Strep PCR negative.  Plan as above.  Discussed labs, MDM, treatment plan, and plan for follow-up with patient. Discussed sn/sx that should prompt return to the ED. patient agrees with plan.   Meds ordered this encounter   Medications   predniSONE (STERAPRED UNI-PAK 21 TAB) 10 MG (21) TBPK tablet    Sig: Dispense one 6 day pack. Take as directed with food.    Dispense:  21 tablet    Refill:  0      *This clinic note was created using [employer]. Therefore, there may be occasional mistakes despite careful proofreading.  ?    Melynda Ripple, MD 07/18/21 [phone]

## 2021-07-18 NOTE — Discharge Instructions (Addendum)
Finish the Steroid taper, if you feel better.  You may want to talk to your doctor about your lisinopril, as the lisinopril could be contributing to this.  To the ER for the signs and symptoms we discussed

## 2022-05-11 ENCOUNTER — Ambulatory Visit
Admission: EM | Admit: 2022-05-11 | Discharge: 2022-05-11 | Disposition: A | Discharge status: Home / Self Care | Payer: 59 | Attending: Family Medicine | Admitting: Family Medicine

## 2022-05-11 DIAGNOSIS — J019 Acute sinusitis, unspecified: Secondary | ICD-10-CM | POA: Diagnosis not present

## 2022-05-11 MED ORDER — PREDNISONE 10 MG (21) PO TBPK: ORAL_TABLET | ORAL | 0 refills | Status: DC

## 2022-05-11 MED ORDER — AMOXICILLIN-POT CLAVULANATE 875-125 MG PO TABS: 1.0000 | ORAL_TABLET | Freq: Two times a day (BID) | ORAL | 0 refills | Status: DC

## 2022-05-11 NOTE — ED Provider Notes (Signed)
[facility]    CSN: [id number] Arrival date & time: 05/11/22  1518      History   Chief Complaint Chief Complaint  Patient presents with   Sore Throat   Nasal Congestion    HPI Sarah Sharp is a 57 y.o. female.   HPI   Sarah Sharp presents for ongoing respiratory symptoms for the past month or so. Has nasal congestion, itchy ears with fluid like feeling in them.  She gargled with salt water for sore throat that started last night. Has painful swallowing.   She took coricidin up until last night but stp[[ed taking it because it didn't seem to help.  Switched Allegra to Zyrtec without relief.    Fever : no Chills: no Sore throat: no   Cough: slight due the drainage Sputum: no Nasal congestion : no Rhinorrhea: yes Myalgias: no Appetite: normal  Hydration: normal  Abdominal pain: no Nausea: no Vomiting: no Diarrhea: No Rash: No Sleep disturbance: yes Headache: yes but resolved      Past Medical History:  Diagnosis Date   Anxiety    Heart murmur    Hypercholesterolemia    Hypertension    Hypothyroidism    Mitral valve disorder    Morbid obesity ([facility])    Thyroid disease     Patient Active Problem List   Diagnosis Date Noted   Morbid obesity with BMI of 45.0-49.9, adult ([facility]) 03/09/2020   Uterine leiomyoma 12/09/2017   History of dysfunctional uterine bleeding 12/09/2017   Family history of ovarian cancer 12/05/2016   Essential hypertension 12/05/2016   Hypothyroidism 12/05/2016   Anxiety and depression 12/05/2016   Family history of colon cancer 05/29/2012    Past Surgical History:  Procedure Laterality Date   COLONOSCOPY  2002,2009, 2014; 07/2017   hx of colon cancer in multiple 1st degree relatives. Findings, multiple small & large mouthed diverticula in sigmoid colon   COLONOSCOPY WITH PROPOFOL N/A 07/19/2017   Procedure: COLONOSCOPY WITH PROPOFOL;  Surgeon: Earline Mayotte, MD;  Location: [facility] ENDOSCOPY;  Service: Endoscopy;   Laterality: N/A;   DILATION AND CURETTAGE OF UTERUS  2007   endometrial polyps   WISDOM TOOTH EXTRACTION      OB History     Gravida  0   Para      Term      Preterm      AB      Living         SAB      IAB      Ectopic      Multiple      Live Births           Obstetric Comments  Age first period 7          Home Medications    Prior to Admission medications   Medication Sig Start Date End Date Taking? Authorizing Provider  amLODipine (NORVASC) 10 MG tablet TAKE 1 TABLET BY MOUTH ONCE DAILY 03/01/17  Yes [provider]  amoxicillin-clavulanate (AUGMENTIN) 875-125 MG tablet Take 1 tablet by mouth every 12 (twelve) hours. 05/11/22  Yes Caydn Justen, Seward Meth, DO  buPROPion (WELLBUTRIN XL) 150 MG 24 hr tablet Take 150 mg by mouth every morning. 03/10/21  Yes [provider]  Cholecalciferol 100 MCG (4000 UT) CAPS Take 2 capsules by mouth daily.   Yes [provider]  levothyroxine (SYNTHROID, LEVOTHROID) 125 MCG tablet TAKE 1 TABLET BY MOUTH ONCE DAILY ON AN EMPTY STOMACH  WITH A GLASS OF  WATER 30-60 MINUTES BEFORE BREAKFAST 10/28/16  Yes [provider]  Multiple Vitamins-Minerals (MULTIVITAMIN ADULT PO) Take by mouth.   Yes [provider]  nortriptyline (PAMELOR) 10 MG capsule TAKE 2 CAPSULES BY MOUTH  NIGHTLY 11/23/16  Yes [provider]  lisinopril-hydrochlorothiazide (PRINZIDE,ZESTORETIC) 10-12.5 MG tablet Take by mouth. 11/03/16 07/18/21  [provider]  predniSONE (STERAPRED UNI-PAK 21 TAB) 10 MG (21) TBPK tablet Dispense one 6 day pack. Take as directed with food. 05/11/22   Katha Cabal, DO  progesterone (PROMETRIUM) 200 MG capsule Take 1 capsule by mouth daily on days 14 to 25 of cycle 03/17/21   Tresea Mall, CNM    Family History Family History  Problem Relation Age of Onset   Cancer Father 30       colon cancer   Colon polyps Sister    Anxiety disorder Sister    Hypertension Mother     Depression Mother    Bladder Cancer Maternal Grandmother 12   Ovarian cancer Maternal Grandmother        vs uterine cancer had hysterectomy   Hypertension Maternal Grandfather    Cancer Paternal Grandmother        colon   Thyroid disease Paternal Grandmother    Bipolar disorder Sister    Colon cancer Other    Breast cancer Neg Hx     Social History Social History   Tobacco Use   Smoking status: Never   Smokeless tobacco: Never  Vaping Use   Vaping Use: Never used  Substance Use Topics   Alcohol use: Yes    Comment: rarely   Drug use: No     Allergies   Patient has no known allergies.   Review of Systems Review of Systems: negative unless otherwise stated in HPI.      Physical Exam Triage Vital Signs ED Triage Vitals  Enc Vitals Group     BP 05/11/22 1558 (!) 140/85     Pulse Rate 05/11/22 1558 93     Resp --      Temp 05/11/22 1558 99.1 F (37.3 C)     Temp Source 05/11/22 1558 Oral     SpO2 05/11/22 1558 100 %     Weight 05/11/22 1556 274 lb (124.3 kg)     Height 05/11/22 1556 5\' 5"  (1.651 m)     Head Circumference --      Peak Flow --      Pain Score 05/11/22 1556 6     Pain Loc --      Pain Edu? --      Excl. in GC? --    No data found.  Updated Vital Signs BP (!) 140/85 (BP Location: Left Arm)   Pulse 93   Temp 99.1 F (37.3 C) (Oral)   Ht 5\' 5"  (1.651 m)   Wt 124.3 kg   SpO2 100%   BMI 45.60 kg/m   Visual Acuity Right Eye Distance:   Left Eye Distance:   Bilateral Distance:    Right Eye Near:   Left Eye Near:    Bilateral Near:     Physical Exam GEN:     alert, non-toxic appearing female in no distress    HENT:  mucus membranes moist, oropharyngeal without lesions or exudate, no tonsillar hypertrophy, mild oropharyngeal erythema,  moderate erythematous edematous turbinates, clear nasal discharge, bilateral TM normal EYES:   pupils equal and reactive, no scleral injection or discharge NECK:  normal ROM,  no meningismus   RESP:  no increased work of breathing, clear to auscultation bilaterally CVS:   regular rate and rhythm Skin:   warm and dry    UC Treatments / Results  Labs (all labs ordered are listed, but only abnormal results are displayed) Labs Reviewed - No data to display  EKG   Radiology No results found.  Procedures Procedures (including critical care time)  Medications Ordered in UC Medications - No data to display  Initial Impression / Assessment and Plan / UC Course  I have reviewed the triage vital signs and the nursing notes.  Pertinent labs & imaging results that were available during my care of the patient were reviewed by me and considered in my medical decision making (see chart for details).       Pt is a 57 y.o. female who presents for respiratory symptoms that are not improving.  Sarah Sharp has an elevated temperature here 99.1 F.   Satting well on room air. Overall pt is  non-toxic appearing, well hydrated, without respiratory distress. Pulmonary exam is unremarkable. COVID testing deferred due to length of symptoms. History and exam concerning for rhinosinusitis.  Treat with steroids and antibiotics as below.  Typical duration of symptoms discussed. Return and ED precautions given and patient voiced understanding.   Discussed MDM, treatment plan and plan for follow-up with patient who agrees with plan.      Final Clinical Impressions(s) / UC Diagnoses   Final diagnoses:  Acute rhinosinusitis     Discharge Instructions      Stop by the pharmacy to pick up your prescriptions.  Follow up with your primary care provider as needed.      ED Prescriptions     Medication Sig Dispense Auth. Provider   predniSONE (STERAPRED UNI-PAK 21 TAB) 10 MG (21) TBPK tablet Dispense one 6 day pack. Take as directed with food. 21 tablet Edu On, DO   amoxicillin-clavulanate (AUGMENTIN) 875-125 MG tablet Take 1 tablet by mouth every 12 (twelve) hours. 14 tablet Cylah Fannin, Seward Meth,  DO      PDMP not reviewed this encounter.   Katha Cabal, DO 05/17/22 2319

## 2022-05-11 NOTE — ED Triage Notes (Signed)
Pt c/o congestion x59month sore throat x1day  Pt has been taking zyrtec and coricidin pills and cough syrup for her symptoms.   Pt states that when she swallows it feels like "something is stuck in my throat"  Pt is not worried about strep and states she had a negative home covid test this morning.

## 2022-05-11 NOTE — Discharge Instructions (Addendum)
Stop by the pharmacy to pick up your prescriptions.  Follow up with your primary care provider as needed.  

## 2022-05-15 ENCOUNTER — Encounter: Payer: Self-pay | Admitting: Advanced Practice Midwife

## 2022-05-17 ENCOUNTER — Ambulatory Visit: Payer: 59 | Admitting: Advanced Practice Midwife

## 2022-06-29 NOTE — Progress Notes (Signed)
PCP: Gracelyn Nurse, MD   Chief Complaint  Patient presents with   Gynecologic Exam    Hasn't had a full cycle in over a year and recently had one day of spotting, no abnormal pain.    HPI:      Ms. Sarah Sharp is a 57 y.o. G0P0 whose LMP was No LMP recorded. Patient is perimenopausal., presents today for her annual examination.  Her menses are absent since 1/23. No PMB until 1 day spotting with wiping recently. No dysmen. She does have vasomotor sx.   Sex activity: not sexually active.   Last Pap: 01/23/20 Results were: no abnormalities /neg HPV DNA.   Last mammogram: 04/29/21 Results were: normal--routine follow-up in 12 months There is no FH of breast cancer. There is a FH of ovarian cancer in her MGM, genetic testing not done. There is a FH colon cancer in her father and PGM. The patient does not do self-breast exams.  Colonoscopy: 7/19 with Dr. Lemar Livings; FH colon cancer;  Repeat due after 5 years due to [facility]. Has upcoming appt with [facility].   Tobacco use: The patient denies current or previous tobacco use. Alcohol use: none No drug use Exercise: not active  She does get adequate calcium and Vitamin D in her diet.  Labs with PCP.   Patient Active Problem List   Diagnosis Date Noted   Morbid obesity with BMI of 45.0-49.9, adult (HCC) 03/09/2020   Uterine leiomyoma 12/09/2017   History of dysfunctional uterine bleeding 12/09/2017   Family history of ovarian cancer 12/05/2016   Essential hypertension 12/05/2016   Hypothyroidism 12/05/2016   Anxiety and depression 12/05/2016   Family history of colon cancer 05/29/2012    Past Surgical History:  Procedure Laterality Date   COLONOSCOPY  2002,2009, 2014; 07/2017   hx of colon cancer in multiple 1st degree relatives. Findings, multiple small & large mouthed diverticula in sigmoid colon   COLONOSCOPY WITH PROPOFOL N/A 07/19/2017   Procedure: COLONOSCOPY WITH PROPOFOL;  Surgeon: Earline Mayotte, MD;  Location: [facility]  ENDOSCOPY;  Service: Endoscopy;  Laterality: N/A;   DILATION AND CURETTAGE OF UTERUS  2007   endometrial polyps   WISDOM TOOTH EXTRACTION      Family History  Problem Relation Age of Onset   Hypertension Mother    Depression Mother    Cancer Father 48       colon cancer   Colon polyps Sister    Anxiety disorder Sister    Bipolar disorder Sister    Bladder Cancer Maternal Grandmother 58   Ovarian cancer Maternal Grandmother        vs uterine cancer had hysterectomy   Hypertension Maternal Grandfather    Cancer Paternal Grandmother        colon   Thyroid disease Paternal Grandmother    Colon cancer Other    Breast cancer Neg Hx     Social History   Socioeconomic History   Marital status: Single    Spouse name: Not on file   Number of children: 0   Years of education: 14   Highest education level: Associate degree: academic program  Occupational History   Occupation: Health and safety inspector  Tobacco Use   Smoking status: Never   Smokeless tobacco: Never  Vaping Use   Vaping Use: Never used  Substance and Sexual Activity   Alcohol use: Yes    Comment: rarely   Drug use: No   Sexual activity: Not Currently  Birth control/protection: None  Other Topics Concern   Not on file  Social History Narrative   Not on file   Social Determinants of Health   Financial Resource Strain: Not on file  Food Insecurity: Not on file  Transportation Needs: Not on file  Physical Activity: Inactive (12/05/2016)   Exercise Vital Sign    Days of Exercise per Week: 0 days    Minutes of Exercise per Session: 0 min  Stress: No Stress Concern Present (12/05/2016)   [employer] of Occupational Health - Occupational Stress Questionnaire    Feeling of Stress : Not at all  Social Connections: Somewhat Isolated (12/05/2016)   Social Connection and Isolation Panel [NHANES]    Frequency of Communication with Friends and Family: Twice a week    Frequency of Social  Gatherings with Friends and Family: Twice a week    Attends Religious Services: More than 4 times per year    Active Member of [employer] or Organizations: No    Attends Banker Meetings: Never    Marital Status: Never married  Intimate Partner Violence: Not At Risk (12/05/2016)   Humiliation, Afraid, Rape, and Kick questionnaire    Fear of Current or Ex-Partner: No    Emotionally Abused: No    Physically Abused: No    Sexually Abused: No     Current Outpatient Medications:    amLODipine (NORVASC) 10 MG tablet, TAKE 1 TABLET BY MOUTH ONCE DAILY, Disp: , Rfl:    buPROPion (WELLBUTRIN XL) 150 MG 24 hr tablet, Take 150 mg by mouth every morning., Disp: , Rfl:    Cholecalciferol 100 MCG (4000 UT) CAPS, Take 2 capsules by mouth daily., Disp: , Rfl:    levothyroxine (SYNTHROID, LEVOTHROID) 125 MCG tablet, TAKE 1 TABLET BY MOUTH ONCE DAILY ON AN EMPTY STOMACH  WITH A GLASS OF WATER 30-60 MINUTES BEFORE BREAKFAST, Disp: , Rfl:    Multiple Vitamins-Minerals (MULTIVITAMIN ADULT PO), Take by mouth., Disp: , Rfl:    nortriptyline (PAMELOR) 10 MG capsule, TAKE 2 CAPSULES BY MOUTH  NIGHTLY, Disp: , Rfl:    omeprazole (PRILOSEC) 40 MG capsule, , Disp: , Rfl:    lisinopril-hydrochlorothiazide (PRINZIDE,ZESTORETIC) 10-12.5 MG tablet, Take by mouth., Disp: , Rfl:      ROS:  Review of Systems  Constitutional:  Negative for fatigue, fever and unexpected weight change.  Respiratory:  Negative for cough, shortness of breath and wheezing.   Cardiovascular:  Negative for chest pain, palpitations and leg swelling.  Gastrointestinal:  Negative for blood in stool, constipation, diarrhea, nausea and vomiting.  Endocrine: Negative for cold intolerance, heat intolerance and polyuria.  Genitourinary:  Positive for vaginal bleeding. Negative for dyspareunia, dysuria, flank pain, frequency, genital sores, hematuria, menstrual problem, pelvic pain, urgency, vaginal discharge and vaginal pain.   Musculoskeletal:  Negative for back pain, joint swelling and myalgias.  Skin:  Negative for rash.  Neurological:  Negative for dizziness, syncope, light-headedness, numbness and headaches.  Hematological:  Negative for adenopathy.  Psychiatric/Behavioral:  Negative for agitation, confusion, sleep disturbance and suicidal ideas. The patient is not nervous/anxious.    BREAST: No symptoms    Objective: BP 134/86   Ht 5\' 5"  (1.651 m)   Wt 279 lb (126.6 kg)   BMI 46.43 kg/m    Physical Exam Constitutional:      Appearance: She is well-developed.  Genitourinary:     Vulva normal.     Right Labia: No rash, tenderness or lesions.    Left Labia: No  tenderness, lesions or rash.    No vaginal discharge, erythema or tenderness.      Right Adnexa: not tender and no mass present.    Left Adnexa: not tender and no mass present.    No cervical friability or polyp.     Uterus is not enlarged or tender.  Breasts:    Right: No mass, nipple discharge, skin change or tenderness.     Left: No mass, nipple discharge, skin change or tenderness.  Neck:     Thyroid: No thyromegaly.  Cardiovascular:     Rate and Rhythm: Normal rate and regular rhythm.     Heart sounds: Normal heart sounds. No murmur heard. Pulmonary:     Effort: Pulmonary effort is normal.     Breath sounds: Normal breath sounds.  Abdominal:     Palpations: Abdomen is soft.     Tenderness: There is no abdominal tenderness. There is no guarding or rebound.  Musculoskeletal:        General: Normal range of motion.     Cervical back: Normal range of motion.  Lymphadenopathy:     Cervical: No cervical adenopathy.  Neurological:     General: No focal deficit present.     Mental Status: She is alert and oriented to person, place, and time.     Cranial Nerves: No cranial nerve deficit.  Skin:    General: Skin is warm and dry.  Psychiatric:        Mood and Affect: Mood normal.        Behavior: Behavior normal.         Thought Content: Thought content normal.        Judgment: Judgment normal.  Vitals reviewed.     Assessment/Plan:  Encounter for annual routine gynecological examination  Encounter for screening mammogram for malignant neoplasm of breast - Plan: MM 3D SCREENING MAMMOGRAM BILATERAL BREAST; pt to schedule mammo  PMB (postmenopausal bleeding) - Plan: US PELVIS TRANSVAGINAL NON-OB (TV ONLY); check GYN u/s. If WNL, will follow. Given pt's FH, check vistaseq to rule out increased cancer risk.   Screening for colon cancer--pt has upcoming appt  Family history of ovarian cancer - Plan: VistaSeq Hered. Cancer Panel; genetic testing discussed. Pt works at [employer] and wants vistaseq done. Will f/u with results.   Family history of colon cancer - Plan: VistaSeq Hered. Cancer Panel          GYN counsel breast self exam, mammography screening, menopause, adequate intake of calcium and vitamin D, diet and exercise    F/U  Return in about 1 week (around 07/07/2022) for GYN u/s for PMB--[facility] to call with results.  Kynadee Dam B. Charlena Haub, PA-C 06/30/2022 5:08 PM

## 2022-06-30 ENCOUNTER — Encounter: Payer: Self-pay | Admitting: Obstetrics and Gynecology

## 2022-06-30 ENCOUNTER — Ambulatory Visit (INDEPENDENT_AMBULATORY_CARE_PROVIDER_SITE_OTHER): Payer: 59 | Admitting: Obstetrics and Gynecology

## 2022-06-30 VITALS — BP 134/86 | Ht 65.0 in | Wt 279.0 lb

## 2022-06-30 DIAGNOSIS — Z8041 Family history of malignant neoplasm of ovary: Secondary | ICD-10-CM

## 2022-06-30 DIAGNOSIS — N950 Postmenopausal bleeding: Secondary | ICD-10-CM

## 2022-06-30 DIAGNOSIS — Z800 Family history of malignant neoplasm of digestive organs: Secondary | ICD-10-CM

## 2022-06-30 DIAGNOSIS — Z01419 Encounter for gynecological examination (general) (routine) without abnormal findings: Secondary | ICD-10-CM

## 2022-06-30 DIAGNOSIS — Z1231 Encounter for screening mammogram for malignant neoplasm of breast: Secondary | ICD-10-CM

## 2022-06-30 DIAGNOSIS — Z1211 Encounter for screening for malignant neoplasm of colon: Secondary | ICD-10-CM

## 2022-06-30 NOTE — Patient Instructions (Signed)
I value your feedback and you entrusting us with your care. If you get a [facility] patient survey, I would appreciate you taking the time to let us know about your experience today. Thank you!  [facility] ([place])--[phone]  

## 2022-08-02 ENCOUNTER — Ambulatory Visit (INDEPENDENT_AMBULATORY_CARE_PROVIDER_SITE_OTHER): Payer: 59

## 2022-08-02 ENCOUNTER — Telehealth: Payer: Self-pay | Admitting: Obstetrics and Gynecology

## 2022-08-02 DIAGNOSIS — N939 Abnormal uterine and vaginal bleeding, unspecified: Secondary | ICD-10-CM | POA: Diagnosis not present

## 2022-08-02 DIAGNOSIS — N950 Postmenopausal bleeding: Secondary | ICD-10-CM

## 2022-08-02 MED ORDER — MISOPROSTOL 100 MCG PO TABS: 100.0000 ug | ORAL_TABLET | Freq: Once | ORAL | 0 refills | Status: DC

## 2022-08-02 NOTE — Telephone Encounter (Signed)
Pt with 1 episode PMB 6/24. No sx since. GYN u/s with EM=8 mm; smaller leio from 2021 GYN u/s results. Will RTO for EMB. Rx cytotec since nullip/2-3 ibup 1 hr before appt. If neg, can follow sx.  Still waiting on vistaseq results given FH.

## 2022-08-09 ENCOUNTER — Telehealth: Payer: Self-pay

## 2022-08-09 NOTE — Telephone Encounter (Signed)
Received fax from [facility] to call [facility] to initiate PA for pt's VistaSeq test. PA submitted via phone and APPROVED. Approval # [id number]

## 2022-08-15 ENCOUNTER — Ambulatory Visit: Payer: 59 | Admitting: Obstetrics and Gynecology

## 2022-08-18 ENCOUNTER — Telehealth: Payer: Self-pay | Admitting: Obstetrics and Gynecology

## 2022-08-18 NOTE — Telephone Encounter (Signed)
Pt aware of neg Vistaseq panel except ATM VUS. No increased screening at this time. Doing Q5 yr colonoscopies due to FH colon cancer already.   Patient understands these results only apply to her and her children, and this is not indicative of genetic testing results of her other family members. It is recommended that her other family members have genetic testing done.  Pt also understands negative genetic testing doesn't mean she will never get any of these cancers.   Hard copy mailed to pt. F/u prn.

## 2022-09-13 ENCOUNTER — Ambulatory Visit
Admission: RE | Admit: 2022-09-13 | Discharge: 2022-09-13 | Disposition: A | Discharge status: Home / Self Care | Payer: 59 | Source: Ambulatory Visit | Attending: Obstetrics and Gynecology | Admitting: Obstetrics and Gynecology

## 2022-09-13 DIAGNOSIS — Z1231 Encounter for screening mammogram for malignant neoplasm of breast: Secondary | ICD-10-CM | POA: Diagnosis present

## 2022-09-19 ENCOUNTER — Ambulatory Visit
Admission: RE | Admit: 2022-09-19 | Discharge: 2022-09-19 | Disposition: A | Discharge status: Home / Self Care | Payer: 59 | Source: Ambulatory Visit | Attending: Family Medicine | Admitting: Family Medicine

## 2022-09-19 VITALS — BP 147/74 | HR 98 | Temp 98.4°F | Resp 18

## 2022-09-19 DIAGNOSIS — R300 Dysuria: Secondary | ICD-10-CM | POA: Diagnosis not present

## 2022-09-19 LAB — URINALYSIS, W/ REFLEX TO CULTURE (INFECTION SUSPECTED)
Bilirubin Urine: NEGATIVE
Glucose, UA: NEGATIVE mg/dL
Ketones, ur: NEGATIVE mg/dL
Nitrite: NEGATIVE
Protein, ur: NEGATIVE mg/dL
Specific Gravity, Urine: 1.015 (ref 1.005–1.030)
pH: 7.5 (ref 5.0–8.0)

## 2022-09-19 MED ORDER — CEFDINIR 300 MG PO CAPS: 300.0000 mg | ORAL_CAPSULE | Freq: Two times a day (BID) | ORAL | 0 refills | Status: AC

## 2022-09-19 NOTE — Discharge Instructions (Addendum)
I sent your urine for culture. Be sure to speak with your gynecologist about urinary discomfort as well.  Stop by the pharmacy to pick up your prescriptions.

## 2022-09-19 NOTE — ED Triage Notes (Signed)
Patient presents to [facility] for intermittent dysuria, urinary freq x 1 month. Had a virtual visit last Monday and prescribed antibiotic. States even while being treated she continued to have the symptoms. No concern for STDs.   Denies fever or hematuria.

## 2022-09-19 NOTE — ED Provider Notes (Signed)
[facility]    CSN: [id number] Arrival date & time: 09/19/22  0917      History   Chief Complaint Chief Complaint  Patient presents with   Urinary Frequency    Entered by patient     HPI HPI Sarah Sharp is a 57 y.o. female.    Sarah Sharp presents for dysuria intermittently for weeks .  Tried Macrobid and pyridium prior to arrival.  Denies known STI exposure as she is not sexually active nor does she use sex toys. Patient is perimenopausal.   - Abnormal vaginal discharge: no - vaginal odor: no - vaginal bleeding: no - Dysuria: yes - Hematuria: no - Urinary urgency:no  - Urinary frequency: no  - Fever: no - Abdominal pain: no  - Pelvic pain: no - Rash/Skin lesions/mouth ulcers: no - Nausea: no  - Vomiting: no  - Back Pain: no        Past Medical History:  Diagnosis Date   Anxiety    Heart murmur    Hypercholesterolemia    Hypertension    Hypothyroidism    Laryngopharyngeal reflux (LPR)    Mitral valve disorder    Morbid obesity (HCC)    Thyroid disease     Patient Active Problem List   Diagnosis Date Noted   Morbid obesity with BMI of 45.0-49.9, adult (HCC) 03/09/2020   Uterine leiomyoma 12/09/2017   History of dysfunctional uterine bleeding 12/09/2017   Family history of ovarian cancer 12/05/2016   Essential hypertension 12/05/2016   Hypothyroidism 12/05/2016   Anxiety and depression 12/05/2016   Family history of colon cancer 05/29/2012    Past Surgical History:  Procedure Laterality Date   COLONOSCOPY  2002,2009, 2014; 07/2017   hx of colon cancer in multiple 1st degree relatives. Findings, multiple small & large mouthed diverticula in sigmoid colon   COLONOSCOPY WITH PROPOFOL N/A 07/19/2017   Procedure: COLONOSCOPY WITH PROPOFOL;  Surgeon: Earline Mayotte, MD;  Location: [facility] ENDOSCOPY;  Service: Endoscopy;  Laterality: N/A;   DILATION AND CURETTAGE OF UTERUS  2007   endometrial polyps   WISDOM TOOTH EXTRACTION       OB History     Gravida  0   Para      Term      Preterm      AB      Living         SAB      IAB      Ectopic      Multiple      Live Births           Obstetric Comments  Age first period 72          Home Medications    Prior to Admission medications   Medication Sig Start Date End Date Taking? Authorizing Provider  cefdinir (OMNICEF) 300 MG capsule Take 1 capsule (300 mg total) by mouth 2 (two) times daily for 5 days. 09/19/22 09/24/22 Yes Josephine Rudnick, DO  amLODipine (NORVASC) 10 MG tablet TAKE 1 TABLET BY MOUTH ONCE DAILY 03/01/17   [provider]  buPROPion (WELLBUTRIN XL) 150 MG 24 hr tablet Take 150 mg by mouth every morning. 03/10/21   [provider]  Cholecalciferol 100 MCG (4000 UT) CAPS Take 2 capsules by mouth daily.    [provider]  levothyroxine (SYNTHROID, LEVOTHROID) 125 MCG tablet TAKE 1 TABLET BY MOUTH ONCE DAILY ON AN EMPTY STOMACH  WITH A GLASS OF WATER 30-60 MINUTES BEFORE  BREAKFAST 10/28/16   [provider]  lisinopril-hydrochlorothiazide (PRINZIDE,ZESTORETIC) 10-12.5 MG tablet Take by mouth. 11/03/16 07/18/21  [provider]  misoprostol (CYTOTEC) 100 MCG tablet Take 1 tablet (100 mcg total) by mouth once for 1 dose. 10-12 hours before appt 08/02/22 08/02/22  Copland, Ilona Sorrel, PA-C  Multiple Vitamins-Minerals (MULTIVITAMIN ADULT PO) Take by mouth.    [provider]  nortriptyline (PAMELOR) 10 MG capsule TAKE 2 CAPSULES BY MOUTH  NIGHTLY 11/23/16   [provider]  omeprazole (PRILOSEC) 40 MG capsule  05/27/22   [provider]    Family History Family History  Problem Relation Age of Onset   Hypertension Mother    Depression Mother    Cancer Father 53       colon cancer   Colon polyps Sister    Anxiety disorder Sister    Bipolar disorder Sister    Bladder Cancer Maternal Grandmother 65   Ovarian cancer Maternal Grandmother        vs uterine cancer had  hysterectomy   Hypertension Maternal Grandfather    Cancer Paternal Grandmother        colon   Thyroid disease Paternal Grandmother    Colon cancer Other    Breast cancer Neg Hx     Social History Social History   Tobacco Use   Smoking status: Never   Smokeless tobacco: Never  Vaping Use   Vaping status: Never Used  Substance Use Topics   Alcohol use: Yes    Comment: rarely   Drug use: No     Allergies   Patient has no known allergies.   Review of Systems Review of Systems: :negative unless otherwise stated in HPI.      Physical Exam Triage Vital Signs ED Triage Vitals  Encounter Vitals Group     BP      Systolic BP Percentile      Diastolic BP Percentile      Pulse      Resp      Temp      Temp src      SpO2      Weight      Height      Head Circumference      Peak Flow      Pain Score      Pain Loc      Pain Education      Exclude from Growth Chart    No data found.  Updated Vital Signs BP (!) 147/74 (BP Location: Left Arm)   Pulse 98   Temp 98.4 F (36.9 C) (Oral)   Resp 18   SpO2 96%   Visual Acuity Right Eye Distance:   Left Eye Distance:   Bilateral Distance:    Right Eye Near:   Left Eye Near:    Bilateral Near:     Physical Exam GEN: well appearing female in no acute distress  CVS: well perfused  RESP: speaking in full sentences without pause     UC Treatments / Results  Labs (all labs ordered are listed, but only abnormal results are displayed) Labs Reviewed  URINALYSIS, W/ REFLEX TO CULTURE (INFECTION SUSPECTED) - Abnormal; Notable for the following components:      Result Value   Hgb urine dipstick TRACE (*)    Leukocytes,Ua TRACE (*)    Bacteria, UA FEW (*)    All other components within normal limits  URINE CULTURE    EKG   Radiology No results found.  Procedures Procedures (including critical care time)  Medications Ordered in UC Medications - No data to display  Initial Impression / Assessment and  Plan / UC Course  I have reviewed the triage vital signs and the nursing notes.  Pertinent labs & imaging results that were available during my care of the patient were reviewed by me and considered in my medical decision making (see chart for details).        Acute cystitis:  Patient is a 57 y.o. female  who presents for weeks of dysuria.  Overall patient is well-appearing and afebrile.  Vital signs stable.  UA consistent with concerning for possible acute cystitis.  Hematuria supported on microscopy and pt has history of postmenopausal bleeding.  Patient completed treatment for UTI with Macrobid 5-d course 2 days ago. Treat Cefdinir 2 times daily for 5 days.  Urine culture obtained.  Follow-up sensitivities and change antibiotics, if needed.  Return precautions including abdominal pain, fever, chills, nausea, or vomiting given. Follow-up,  if symptoms not improving or getting worse. Discussed MDM, treatment plan and plan for follow-up with patient who agrees with plan.        Final Clinical Impressions(s) / UC Diagnoses   Final diagnoses:  Dysuria     Discharge Instructions      I sent your urine for culture. Be sure to speak with your gynecologist about urinary discomfort as well.  Stop by the pharmacy to pick up your prescriptions.      ED Prescriptions     Medication Sig Dispense Auth. Provider   cefdinir (OMNICEF) 300 MG capsule Take 1 capsule (300 mg total) by mouth 2 (two) times daily for 5 days. 10 capsule Katha Cabal, DO      PDMP not reviewed this encounter.   Katha Cabal, DO 09/19/22 1010

## 2022-09-21 LAB — URINE CULTURE: Culture: 20000 — AB

## 2022-10-06 ENCOUNTER — Other Ambulatory Visit: Payer: Self-pay | Admitting: Obstetrics and Gynecology

## 2022-10-06 NOTE — H&P (Signed)
Preoperative History and Physical   Chief Complaint: Sarah Sharp is a 57 y.o. G0P0000 here for surgical management of complex endometrial polyp.   No significant preoperative concerns.   History of Present Illness: Patient presents for pre-operative appointment.  She presented with postmenopausal bleeding.  She had one day of light spotting in May of this year. Since that time she has had no bleeding. She has had an initial evaluation at [facility] and had a pelvic ultrasound that showed some fibroids and an 8 mm endometrial stripe (see details below).  It was recommended to her to have an endometrial biopsy and she is here for a second opinion regarding this.   Last pap smear 01/2020, NILM, HPV negative   Ultrasound results from 08/02/2022 (report): Location: [place] [facility] at [facility]  Date of Service: 08/02/2022   Indications:Abnormal Uterine Bleeding  Findings:  The uterus is retroverted and measures 5.63 x 4.7 x 4.75 cm.  Echo texture is heterogenous with evidence of focal masses.  Within the uterus there is  a suspected fibroid.  measuring:  Fibroid 1:3.10 x 2.62 x 3.62 cm subserosal   The Endometrium measures 8 mm. With fluid within, no vascular polyp stalk  seen   Right Ovary not well seen  Left Ovary not well seen  Survey of the adnexa demonstrates no adnexal masses.  There is no free fluid in the cul de sac.   Impression:  1. Fibroid seen  2. Irregular endometrium   Recommendations:  1.Clinical correlation with the patient's History and Physical Exam.  2.  Assuming the diagnosis of abnormal uterine bleeding is correct-an 8 mm  endometrium is not significant.  However if the patient is postmenopausal  this may be significant.  3.  Uterine fibroid    Endometrial biopsy 09/20/2022: COMPLEX HYPERPLASTIC PAPILLARY PROLIFERATION WITH MUCINOUS METAPLASIA INVOLVING AN ENDOMETRIAL POLYP (SEE COMMENT). NO ATYPICAL HYPERPLASIA OR CARCINOMA.    Proposed surgery:  Hysteroscopy, dilation and curettage, endometrial polypectomy   Past Medical History      Past Medical History:  Diagnosis Date   Anemia 2007   Anxiety 2007   Depression 2007   Fibroid     Heart murmur 1990s   Hyperlipidemia 2007   Hypertension 2007   Thyroid disease 2007      Past Surgical History       Past Surgical History:  Procedure Laterality Date   DILATION AND CURETTAGE, DIAGNOSTIC / THERAPEUTIC   03/2005   COLONOSCOPY   07/19/2017    @ [facility] with Dr. Lemar Livings, INFLAMED SERRATED POLYP, FAVOR A SESSILE SERRATED ADENOMA. TUBULAR ADENOMA.   DILATION AND CURETTAGE OF UTERUS   2007              OB History  Gravida Para Term Preterm AB Living  0 0 0 0 0 0  SAB IAB Ectopic Molar Multiple Live Births   0 0 0 0 0 0  Patient denies any other pertinent gynecologic issues.    Medications Ordered Prior to Encounter        Current Outpatient Medications on File Prior to Visit  Medication Sig Dispense Refill   amLODIPine (NORVASC) 10 MG tablet Take 1 tablet (10 mg total) by mouth once daily 90 tablet 3   buPROPion (WELLBUTRIN XL) 150 MG XL tablet TAKE 1 TABLET BY MOUTH ONCE  DAILY 90 tablet 1   cholecalciferol (VITAMIN D3) 2,000 unit capsule Take 2,000 Units by mouth once daily.       ciprofloxacin HCl (CIPRO) 500  MG tablet Take 1 tablet (500 mg total) by mouth 2 (two) times daily for 10 days 20 tablet 0   levothyroxine (SYNTHROID) 125 MCG tablet Take 1 tablet (125 mcg total) by mouth every morning before breakfast (0630) ON AN EMPTY STOMACH WITH A GLASS OF WATER 30 TO 60 MINUTES BEFORE BREAKFAST 90 tablet 3   lisinopriL-hydroCHLOROthiazide (ZESTORETIC) 10-12.5 mg tablet Take 1 tablet by mouth once daily 90 tablet 3   multivit-minerals/ferrous fum (MULTI VITAMIN ORAL) Take 1 tablet by mouth once daily         nortriptyline (PAMELOR) 10 MG capsule TAKE 2 CAPSULES BY MOUTH AT  BEDTIME 180 capsule 3   omeprazole (PRILOSEC) 40 MG DR capsule Take 40 mg by mouth once daily        semaglutide (WEGOVY) 0.25 mg/0.5 mL pen injector Inject 0.5 mLs (0.25 mg total) subcutaneously once a week (Patient not taking: Reported on 07/07/2022) 2 mL 1   sodium, potassium, and magnesium (SUPREP) oral solution Take 1 Bottle by mouth as directed One kit contains 2 bottles.  Take both bottles at the times instructed by your provider. (Patient not taking: Reported on 08/30/2022) 354 mL 0    No current facility-administered medications on file prior to visit.      Allergies  No Known Allergies     Social History:   reports that she has never smoked. She has never used smokeless tobacco. She reports that she does not drink alcohol and does not use drugs.   Family History        Family History  Problem Relation Name Age of Onset   High blood pressure (Hypertension) Mother Dajia Chambley     Hyperlipidemia (Elevated cholesterol) Mother Faline Leidel     Depression Mother Hermia Clohessy     Allergic rhinitis Mother Lallie Latourette     Colon cancer Father Autiana Bault 51   Rectal cancer Father Janautica Desantiago     Bipolar disorder Sister Luisa Hart     Depression Sister [facility]     Depression Sister Tessie Fass     Colon polyps Sister Tessie Fass 30   Uterine cancer Maternal Grandmother [facility] Des[facility]ctis          Review of Systems: Noncontributory   PHYSICAL EXAM: Blood pressure (!) 149/83, pulse 109, height 165.1 cm (5\' 5" ), weight (!) 126 kg (277 lb 12.8 oz), last menstrual period 01/17/2021. CONSTITUTIONAL: Well-developed, well-nourished female in no acute distress.  HENT:  Normocephalic, atraumatic, External right and left ear normal. Oropharynx is clear and moist EYES: Conjunctivae and EOM are normal. Pupils are equal, round, and reactive to light. No scleral icterus.  NECK: Normal range of motion, supple, no masses SKIN: Skin is warm and dry. No rash noted. Not diaphoretic. No erythema. No pallor. NEUROLGIC: Alert and oriented to person, place, and time. Normal reflexes, muscle tone  coordination. No cranial nerve deficit noted. PSYCHIATRIC: Normal mood and affect. Normal behavior. Normal judgment and thought content. CARDIOVASCULAR: Normal heart rate noted, regular rhythm RESPIRATORY: Effort and breath sounds normal, no problems with respiration noted ABDOMEN: Soft, nontender, nondistended. PELVIC: Deferred MUSCULOSKELETAL: Normal range of motion. No edema and no tenderness. 2+ distal pulses.   Labs: Recent Results       Recent Results (from the past 336 hour(s))  Pathology Report - [facility]    Collection Time: 09/20/22  8:59 AM  Result Value Ref Range    Diagnosis Synopsis: - [facility] Comment      Specimen: - [facility]  Comment      Clinical history: - [facility] Comment      DIAGNOSIS: - [facility] Comment      Comment: - [facility] Comment      Gross description: - Freight forwarder by: Verdell Carmine Comment      CPT Code(s): - [facility] Comment      CPT Disclaimer: - [facility] Comment      Clinical Provided ICD: - [facility] Comment          Imaging Studies: No results found.   Assessment: 1. Postmenopausal bleeding   2. Thickened endometrium   3. Endometrial polyp       Plan: Patient will undergo surgical management with the above-noted surgery.   The risks of surgery were discussed in detail with the patient including but not limited to: bleeding which may require transfusion or reoperation; infection which may require antibiotics; injury to surrounding organs which may involve bowel, bladder, ureters ; need for additional procedures including laparoscopy or laparotomy; thromboembolic phenomenon, surgical site problems and other postoperative/anesthesia complications. Likelihood of success in alleviating the patient's condition was discussed. Routine postoperative instructions will be reviewed with the patient and her family in detail after surgery.  The patient concurred with the proposed plan, giving informed written consent for the surgery.    Preoperative prophylactic antibiotics, as indicated, and SCDs ordered on call to the OR.       Attestation Statement:    I personally performed the service. (TP)   Clint Biello Teola Bradley, MD  [facility] 10/03/2022 11:41 AM

## 2022-10-07 ENCOUNTER — Encounter: Payer: Self-pay | Admitting: Urgent Care

## 2022-10-07 ENCOUNTER — Encounter
Admission: RE | Admit: 2022-10-07 | Discharge: 2022-10-07 | Disposition: A | Discharge status: Home / Self Care | Payer: 59 | Source: Ambulatory Visit | Attending: Obstetrics and Gynecology | Admitting: Obstetrics and Gynecology

## 2022-10-07 ENCOUNTER — Other Ambulatory Visit: Payer: Self-pay

## 2022-10-07 VITALS — Ht 65.0 in | Wt 280.0 lb

## 2022-10-07 DIAGNOSIS — Z79899 Other long term (current) drug therapy: Secondary | ICD-10-CM | POA: Diagnosis not present

## 2022-10-07 DIAGNOSIS — Z6841 Body Mass Index (BMI) 40.0 and over, adult: Secondary | ICD-10-CM | POA: Diagnosis not present

## 2022-10-07 DIAGNOSIS — Z01818 Encounter for other preprocedural examination: Secondary | ICD-10-CM | POA: Insufficient documentation

## 2022-10-07 DIAGNOSIS — I10 Essential (primary) hypertension: Secondary | ICD-10-CM | POA: Diagnosis not present

## 2022-10-07 DIAGNOSIS — I498 Other specified cardiac arrhythmias: Secondary | ICD-10-CM | POA: Diagnosis not present

## 2022-10-07 HISTORY — DX: Family history of other specified conditions: Z84.89

## 2022-10-07 LAB — BASIC METABOLIC PANEL
Anion gap: 11 (ref 5–15)
BUN: 18 mg/dL (ref 6–20)
CO2: 24 mmol/L (ref 22–32)
Calcium: 9.5 mg/dL (ref 8.9–10.3)
Chloride: 102 mmol/L (ref 98–111)
Creatinine, Ser: 1.06 mg/dL — ABNORMAL HIGH (ref 0.44–1.00)
GFR, Estimated: >60 mL/min (ref >60)
Glucose, Bld: 102 mg/dL — ABNORMAL HIGH (ref 70–99)
Potassium: 3.7 mmol/L (ref 3.5–5.1)
Sodium: 137 mmol/L (ref 135–145)

## 2022-10-07 LAB — CBC
HCT: 39.9 % (ref 36.0–46.0)
Hemoglobin: 12.9 g/dL (ref 12.0–15.0)
MCH: 27.2 pg (ref 26.0–34.0)
MCHC: 32.3 g/dL (ref 30.0–36.0)
MCV: 84.2 fL (ref 80.0–100.0)
Platelets: 403 10*3/uL — ABNORMAL HIGH (ref 150–400)
RBC: 4.74 MIL/uL (ref 3.87–5.11)
RDW: 13.3 % (ref 11.5–15.5)
WBC: 9 10*3/uL (ref 4.0–10.5)
nRBC: 0 % (ref 0.0–0.2)

## 2022-10-07 NOTE — Patient Instructions (Signed)
Your procedure is scheduled on: Tuesday 10/11/22 To find out your arrival time, please call [phone] between 1PM - 3PM on:   Monday 10/10/22 Report to the Registration Desk on the 1st floor of the [facility]. FREE Valet parking is available.  If your arrival time is 6:00 am, do not arrive before that time as the [facility] entrance doors do not open until 6:00 am.  REMEMBER: Instructions that are not followed completely may result in serious medical risk, up to and including death; or upon the discretion of your surgeon and anesthesiologist your surgery may need to be rescheduled.  Do not eat food after midnight the night before surgery.  No gum chewing or hard candies.  You may however, drink CLEAR liquids up to 2 hours before you are scheduled to arrive for your surgery. Do not drink anything within 2 hours of your scheduled arrival time.  Clear liquids include: - water  - apple juice without pulp - gatorade (not RED colors) - black coffee or tea (Do NOT add milk or creamers to the coffee or tea) Do NOT drink anything that is not on this list.  Type 1 and Type 2 diabetics should only drink water.  In addition, your doctor has ordered for you to drink the provided:  Ensure Pre-Surgery Clear Carbohydrate Drink  Drinking this carbohydrate drink up to two hours before surgery helps to reduce insulin resistance and improve patient outcomes. Please complete drinking 2 hours before scheduled arrival time.  One week prior to surgery: Stop Anti-inflammatories (NSAIDS) such as Advil, Aleve, Ibuprofen, Motrin, Naproxen, Naprosyn and Aspirin based products such as Excedrin, Goody's Powder, BC Powder. You may however, continue to take Tylenol if needed for pain up until the day of surgery.  Stop ANY OVER THE COUNTER supplements or vitamins until after surgery.  Continue taking all prescribed medications.   TAKE ONLY THESE MEDICATIONS THE MORNING OF SURGERY WITH A SIP OF  WATER:  buPROPion (WELLBUTRIN XL) 150 MG 24 hr tablet  levothyroxine (SYNTHROID, LEVOTHROID) 125 MCG tablet  omeprazole (PRILOSEC) 40 MG capsule Antacid (take one the night before and one on the morning of surgery - helps to prevent nausea after surgery.)  No Alcohol for 24 hours before or after surgery.  No Smoking including e-cigarettes for 24 hours before surgery.  No chewable tobacco products for at least 6 hours before surgery.  No nicotine patches on the day of surgery.  Do not use any "recreational" drugs for at least a week (preferably 2 weeks) before your surgery.  Please be advised that the combination of cocaine and anesthesia may have negative outcomes, up to and including death. If you test positive for cocaine, your surgery will be cancelled.  On the morning of surgery brush your teeth with toothpaste and water, you may rinse your mouth with mouthwash if you wish. Do not swallow any toothpaste or mouthwash.  Use CHG Soap or wipes as directed on instruction sheet.  Do not wear lotions, powders, or perfumes.   Do not shave body hair from the neck down 48 hours before surgery.  Wear comfortable clothing (specific to your surgery type) to the hospital.  Do not wear jewelry, make-up, hairpins, clips or nail polish.  For welded (permanent) jewelry: bracelets, anklets, waist bands, etc.  Please have this removed prior to surgery.  If it is not removed, there is a chance that hospital personnel will need to cut it off on the day of surgery. Contact lenses, hearing  aids and dentures may not be worn into surgery.  Do not bring valuables to the hospital. [facility] is not responsible for any missing/lost belongings or valuables.   Notify your doctor if there is any change in your medical condition (cold, fever, infection).  If you are being discharged the day of surgery, you will not be allowed to drive home. You will need a responsible individual to drive you home and stay  with you for 24 hours after surgery.   If you are taking public transportation, you will need to have a responsible individual with you.  If you are being admitted to the hospital overnight, leave your suitcase in the car. After surgery it may be brought to your room.  In case of increased patient census, it may be necessary for you, the patient, to continue your postoperative care in the Same Day Surgery department.  After surgery, you can help prevent lung complications by doing breathing exercises.  Take deep breaths and cough every 1-2 hours. Your doctor may order a device called an Incentive Spirometer to help you take deep breaths. When coughing or sneezing, hold a pillow firmly against your incision with both hands. This is called "splinting." Doing this helps protect your incision. It also decreases belly discomfort.  Surgery Visitation Policy:  Patients undergoing a surgery or procedure may have two family members or support persons with them as long as the person is not COVID-19 positive or experiencing its symptoms.   Inpatient Visitation:    Visiting hours are 7 a.m. to 8 p.m. Up to four visitors are allowed at one time in a patient room. The visitors may rotate out with other people during the day. One designated support person (adult) may remain overnight.  Please call the Pre-admissions Testing Dept. at [phone] if you have any questions about these instructions.

## 2022-10-10 MED ORDER — CHLORHEXIDINE GLUCONATE 0.12 % MT SOLN
15.0000 mL | Freq: Once | OROMUCOSAL | Status: AC
  Administered 2022-10-11: 15 mL via OROMUCOSAL

## 2022-10-10 MED ORDER — ORAL CARE MOUTH RINSE: 15.0000 mL | Freq: Once | OROMUCOSAL | Status: AC

## 2022-10-10 MED ORDER — LACTATED RINGERS IV SOLN: INTRAVENOUS | Status: DC

## 2022-10-11 ENCOUNTER — Ambulatory Visit: Payer: 59 | Admitting: Urgent Care

## 2022-10-11 ENCOUNTER — Ambulatory Visit: Payer: 59 | Admitting: General Practice

## 2022-10-11 ENCOUNTER — Other Ambulatory Visit: Payer: Self-pay

## 2022-10-11 ENCOUNTER — Ambulatory Visit
Admission: RE | Admit: 2022-10-11 | Discharge: 2022-10-11 | Disposition: A | Discharge status: Home / Self Care | Payer: 59 | Attending: Obstetrics and Gynecology | Admitting: Obstetrics and Gynecology

## 2022-10-11 ENCOUNTER — Encounter
Admission: RE | Disposition: A | Discharge status: Home / Self Care | Payer: Self-pay | Source: Home / Self Care | Attending: Obstetrics and Gynecology

## 2022-10-11 ENCOUNTER — Encounter: Payer: Self-pay | Admitting: Obstetrics and Gynecology

## 2022-10-11 DIAGNOSIS — R9389 Abnormal findings on diagnostic imaging of other specified body structures: Secondary | ICD-10-CM | POA: Insufficient documentation

## 2022-10-11 DIAGNOSIS — E039 Hypothyroidism, unspecified: Secondary | ICD-10-CM | POA: Diagnosis not present

## 2022-10-11 DIAGNOSIS — F419 Anxiety disorder, unspecified: Secondary | ICD-10-CM | POA: Insufficient documentation

## 2022-10-11 DIAGNOSIS — Z6841 Body Mass Index (BMI) 40.0 and over, adult: Secondary | ICD-10-CM | POA: Insufficient documentation

## 2022-10-11 DIAGNOSIS — N950 Postmenopausal bleeding: Secondary | ICD-10-CM | POA: Diagnosis present

## 2022-10-11 DIAGNOSIS — I10 Essential (primary) hypertension: Secondary | ICD-10-CM | POA: Insufficient documentation

## 2022-10-11 DIAGNOSIS — N840 Polyp of corpus uteri: Secondary | ICD-10-CM | POA: Diagnosis not present

## 2022-10-11 HISTORY — PX: HYSTEROSCOPY WITH D & C: SHX1775

## 2022-10-11 SURGERY — DILATATION AND CURETTAGE /HYSTEROSCOPY
Anesthesia: General

## 2022-10-11 MED ORDER — CHLORHEXIDINE GLUCONATE 0.12 % MT SOLN
OROMUCOSAL | Status: AC
  Filled 2022-10-11: qty 15

## 2022-10-11 MED ORDER — PROPOFOL 1000 MG/100ML IV EMUL
INTRAVENOUS | Status: AC
  Filled 2022-10-11: qty 100

## 2022-10-11 MED ORDER — PROPOFOL 10 MG/ML IV BOLUS
INTRAVENOUS | Status: DC | PRN
  Administered 2022-10-11: 200 mg via INTRAVENOUS
  Administered 2022-10-11: 50 mg via INTRAVENOUS

## 2022-10-11 MED ORDER — DEXAMETHASONE SODIUM PHOSPHATE 10 MG/ML IJ SOLN
INTRAMUSCULAR | Status: AC
  Filled 2022-10-11: qty 1

## 2022-10-11 MED ORDER — FENTANYL CITRATE (PF) 100 MCG/2ML IJ SOLN: 25.0000 ug | INTRAMUSCULAR | Status: DC | PRN

## 2022-10-11 MED ORDER — OXYCODONE HCL 5 MG PO TABS: 5.0000 mg | ORAL_TABLET | Freq: Once | ORAL | Status: DC | PRN

## 2022-10-11 MED ORDER — LIDOCAINE HCL (CARDIAC) PF 100 MG/5ML IV SOSY
PREFILLED_SYRINGE | INTRAVENOUS | Status: DC | PRN
  Administered 2022-10-11: 100 mg via INTRAVENOUS

## 2022-10-11 MED ORDER — DEXAMETHASONE SODIUM PHOSPHATE 10 MG/ML IJ SOLN
INTRAMUSCULAR | Status: DC | PRN
  Administered 2022-10-11: 8 mg via INTRAVENOUS

## 2022-10-11 MED ORDER — FENTANYL CITRATE (PF) 100 MCG/2ML IJ SOLN
INTRAMUSCULAR | Status: AC
  Filled 2022-10-11: qty 2

## 2022-10-11 MED ORDER — ONDANSETRON HCL 4 MG/2ML IJ SOLN
INTRAMUSCULAR | Status: AC
  Filled 2022-10-11: qty 2

## 2022-10-11 MED ORDER — MIDAZOLAM HCL 2 MG/2ML IJ SOLN
INTRAMUSCULAR | Status: DC | PRN
  Administered 2022-10-11: 2 mg via INTRAVENOUS

## 2022-10-11 MED ORDER — OXYCODONE HCL 5 MG/5ML PO SOLN: 5.0000 mg | Freq: Once | ORAL | Status: DC | PRN

## 2022-10-11 MED ORDER — KETOROLAC TROMETHAMINE 30 MG/ML IJ SOLN
INTRAMUSCULAR | Status: DC | PRN
  Administered 2022-10-11: 30 mg via INTRAVENOUS

## 2022-10-11 MED ORDER — IBUPROFEN 600 MG PO TABS: 600.0000 mg | ORAL_TABLET | Freq: Four times a day (QID) | ORAL | 0 refills | Status: DC

## 2022-10-11 MED ORDER — KETOROLAC TROMETHAMINE 30 MG/ML IJ SOLN
INTRAMUSCULAR | Status: AC
  Filled 2022-10-11: qty 1

## 2022-10-11 MED ORDER — SILVER NITRATE-POT NITRATE 75-25 % EX MISC
CUTANEOUS | Status: AC
  Filled 2022-10-11: qty 10

## 2022-10-11 MED ORDER — 0.9 % SODIUM CHLORIDE (POUR BTL) OPTIME
TOPICAL | Status: DC | PRN
  Administered 2022-10-11: 500 mL

## 2022-10-11 MED ORDER — PROPOFOL 10 MG/ML IV BOLUS
INTRAVENOUS | Status: AC
  Filled 2022-10-11: qty 20

## 2022-10-11 MED ORDER — LIDOCAINE HCL (PF) 2 % IJ SOLN
INTRAMUSCULAR | Status: AC
  Filled 2022-10-11: qty 5

## 2022-10-11 MED ORDER — SILVER NITRATE-POT NITRATE 75-25 % EX MISC
CUTANEOUS | Status: DC | PRN
  Administered 2022-10-11: 4

## 2022-10-11 MED ORDER — FENTANYL CITRATE (PF) 100 MCG/2ML IJ SOLN
INTRAMUSCULAR | Status: DC | PRN
  Administered 2022-10-11 (×4): 50 ug via INTRAVENOUS

## 2022-10-11 MED ORDER — MIDAZOLAM HCL 2 MG/2ML IJ SOLN
INTRAMUSCULAR | Status: AC
  Filled 2022-10-11: qty 2

## 2022-10-11 MED ORDER — PROPOFOL 500 MG/50ML IV EMUL
INTRAVENOUS | Status: DC | PRN
  Administered 2022-10-11: 50 ug/kg/min via INTRAVENOUS

## 2022-10-11 MED ORDER — ONDANSETRON HCL 4 MG/2ML IJ SOLN
INTRAMUSCULAR | Status: DC | PRN
  Administered 2022-10-11: 4 mg via INTRAVENOUS

## 2022-10-11 SURGICAL SUPPLY — 27 items
BAG DRN RND TRDRP ANRFLXCHMBR (UROLOGICAL SUPPLIES)
BAG URINE DRAIN 2000ML AR STRL (UROLOGICAL SUPPLIES) IMPLANT
CATH FOLEY 2WAY 5CC 16FR (CATHETERS)
CATH URTH 16FR FL 2W BLN LF (CATHETERS) IMPLANT
DEVICE MYOSURE LITE (MISCELLANEOUS) IMPLANT
DEVICE MYOSURE REACH (MISCELLANEOUS) IMPLANT
DRSG TELFA 3X8 NADH STRL (GAUZE/BANDAGES/DRESSINGS) IMPLANT
ELECT REM PT RETURN 9FT ADLT (ELECTROSURGICAL) ×1
ELECTRODE REM PT RTRN 9FT ADLT (ELECTROSURGICAL) ×1 IMPLANT
GLOVE BIO SURGEON STRL SZ7 (GLOVE) ×1 IMPLANT
GLOVE BIOGEL PI IND STRL 7.5 (GLOVE) ×1 IMPLANT
GOWN STRL REUS W/ TWL LRG LVL3 (GOWN DISPOSABLE) ×2 IMPLANT
GOWN STRL REUS W/TWL LRG LVL3 (GOWN DISPOSABLE) ×2
IV NS IRRIG 3000ML ARTHROMATIC (IV SOLUTION) ×1 IMPLANT
KIT PROCEDURE FLUENT (KITS) ×1 IMPLANT
KIT TURNOVER CYSTO (KITS) ×1 IMPLANT
MANIFOLD NEPTUNE II (INSTRUMENTS) ×1 IMPLANT
PACK DNC HYST (MISCELLANEOUS) ×1 IMPLANT
PAD OB MATERNITY 4.3X12.25 (PERSONAL CARE ITEMS) ×1 IMPLANT
PAD PREP OB/GYN DISP 24X41 (PERSONAL CARE ITEMS) ×1 IMPLANT
SCRUB CHG 4% DYNA-HEX 4OZ (MISCELLANEOUS) ×1 IMPLANT
SEAL ROD LENS SCOPE MYOSURE (ABLATOR) ×1 IMPLANT
SET CYSTO W/LG BORE CLAMP LF (SET/KITS/TRAYS/PACK) IMPLANT
SURGILUBE 2OZ TUBE FLIPTOP (MISCELLANEOUS) ×1 IMPLANT
TRAP FLUID SMOKE EVACUATOR (MISCELLANEOUS) ×1 IMPLANT
TUBING CONNECTING 10 (TUBING) ×1 IMPLANT
WATER STERILE IRR 500ML POUR (IV SOLUTION) ×1 IMPLANT

## 2022-10-11 NOTE — Transfer of Care (Signed)
Immediate Anesthesia Transfer of Care Note  Patient: Sarah Sharp  Procedure(s) Performed: DILATATION AND CURETTAGE /HYSTEROSCOPY/POLYPECTOMY  Patient Location: PACU  Anesthesia Type:General  Level of Consciousness: drowsy and patient cooperative  Airway & Oxygen Therapy: Patient Spontanous Breathing and Patient connected to face mask oxygen  Post-op Assessment: Report given to RN and Post -op Vital signs reviewed and stable  Post vital signs: stable  Last Vitals:  Vitals Value Taken Time  BP    Temp    Pulse    Resp 17 10/11/22 0839  SpO2    Vitals shown include unfiled device data.  Last Pain:  Vitals:   10/11/22 0624  TempSrc: Oral  PainSc: 0-No pain         Complications: No notable events documented.

## 2022-10-11 NOTE — Interval H&P Note (Signed)
History and Physical Interval Note:  10/11/2022 7:17 AM  Sarah Sharp  has presented today for surgery, with the diagnosis of postmenopausal bleeding, endometrial polyp.  The various methods of treatment have been discussed with the patient and family. After consideration of risks, benefits and other options for treatment, the patient has consented to  Procedure(s): DILATATION AND CURETTAGE /HYSTEROSCOPY/POLYPECTOMY (N/A) as a surgical intervention.  The patient's history has been reviewed, patient examined, no change in status, stable for surgery.  I have reviewed the patient's chart and labs.  Questions were answered to the patient's satisfaction.    Thomasene Mohair, MD, [facility] 10/11/2022 7:17 AM

## 2022-10-11 NOTE — Discharge Instructions (Signed)

## 2022-10-11 NOTE — Anesthesia Preprocedure Evaluation (Signed)
Anesthesia Evaluation  Patient identified by MRN, date of birth, ID band Patient awake    Reviewed: Allergy & Precautions, H&P , NPO status , Patient's Chart, lab work & pertinent test results, reviewed documented beta blocker date and time   Airway Mallampati: III  TM Distance: >3 FB Neck ROM: full    Dental  (+) Caps, Dental Advidsory Given, Teeth Intact   Pulmonary neg pulmonary ROS          Cardiovascular Exercise Tolerance: Good hypertension, (-) angina (-) CAD, (-) Past MI, (-) Cardiac Stents and (-) CABG (-) dysrhythmias + Valvular Problems/Murmurs      Neuro/Psych  PSYCHIATRIC DISORDERS Anxiety     negative neurological ROS     GI/Hepatic negative GI ROS, Neg liver ROS,,,  Endo/Other  neg diabetesHypothyroidism  Morbid obesity  Renal/GU      Musculoskeletal   Abdominal   Peds  Hematology negative hematology ROS (+)   Anesthesia Other Findings Past Medical History: No date: Anxiety No date: Heart murmur No date: Hypercholesterolemia No date: Hypertension No date: Hypothyroidism No date: Mitral valve disorder No date: Morbid obesity (HCC) No date: Thyroid disease   Reproductive/Obstetrics negative OB ROS                             Anesthesia Physical Anesthesia Plan  ASA: 3  Anesthesia Plan: General   Post-op Pain Management:    Induction: Intravenous  PONV Risk Score and Plan: 2 and Ondansetron, Dexamethasone and Midazolam  Airway Management Planned: LMA  Additional Equipment:   Intra-op Plan:   Post-operative Plan: Extubation in OR  Informed Consent: I have reviewed the patients History and Physical, chart, labs and discussed the procedure including the risks, benefits and alternatives for the proposed anesthesia with the patient or authorized representative who has indicated his/her understanding and acceptance.     Dental Advisory Given  Plan Discussed  with: Anesthesiologist, CRNA and Surgeon  Anesthesia Plan Comments: (Patient consented for risks of anesthesia including but not limited to:  - adverse reactions to medications - damage to eyes, teeth, lips or other oral mucosa - nerve damage due to positioning  - sore throat or hoarseness - Damage to heart, brain, nerves, lungs, other parts of body or loss of life  Patient voiced understanding.)       Anesthesia Quick Evaluation

## 2022-10-11 NOTE — Op Note (Signed)
Operative Note   Name: [name]  Date of Service: 10/11/2022  DOB: 11-28-1965  MRN: [id number]   PRE-OP DIAGNOSIS:  1) Postmenopausal Bleeding 2) Thickened endometrium 3) Endometrial polyp   POST-OP DIAGNOSIS:  1) Postmenopausal Bleeding 2) Thickened endometrium 3) Endometrial polyp   SURGEON: Surgeons and Role:    Conard Novak, MD - Primary  PROCEDURE: Procedure(s): DILATATION AND CURETTAGE /HYSTEROSCOPY/POLYPECTOMY   ANESTHESIA: Choice   ESTIMATED BLOOD LOSS: 5 mL  DRAINS: none   TOTAL IV FLUIDS: 600 mL  SPECIMENS:  Endometrial curettings  VTE PROPHYLAXIS: SCDs to the bilateral lower extremities  ANTIBIOTICS: none indicated  FLUID DEFICIT: 65 mL  COMPLICATIONS: none  DISPOSITION: PACU - hemodynamically stable.  CONDITION: stable  INDICATION: 57 y.o. female with postmenopausal bleeding. Ultrasound showed a thickened endometrium. Endometrial biopsy showed a polypoid lesion with complex hyperplastic papillary proliferation with mucinous metaplasia involving an endometrial polyp. No hyperplasia or carcinoma noted.  FINDINGS: Exam under anesthesia revealed small, mobile midline uterus with no masses and bilateral adnexa without masses or fullness. Hysteroscopy revealed a uterine cavity with an anterior/fundal lesion just left of midline that was firm and likely corresponds to a fibroid noted on ultrasound, otherwise normal uterine cavity with bilateral tubal ostia and normal appearing endocervical canal.   PROCEDURE IN DETAIL:  After informed consent was obtained, the patient was taken to the operating room where anesthesia was obtained without difficulty. The patient was positioned in the dorsal lithotomy position in [place] stirrups.  A Time Out was performed. The patient's bladder was catheterized with an in and out foley catheter.  The patient was examined under anesthesia, with the above noted findings.  The bi-valved speculum was placed inside the  patient's vagina, and the the anterior lip of the cervix was grasped with the tenaculum.  Then the cervix was progressively dilated to a 7 mm Hegar dilator.  The hysteroscope was introduced, with the above noted findings. The hysteroscope was utilized to use the MyoSure Reach device to obtain directed biopsies.   The hystersocope was removed and the uterine cavity was curetted until a gritty texture was noted, yielding minimal endometrial curettings.  The hysteroscope was re-introduced and the pressure was gradually lowered to a level well below the MAP to ensure hemostasis. Excellent hemostasis was noted and the hysteroscope was removed.  The tenaculum was removed and hemostasis was obtained with the use of silver nitrate.  Ongoing hemostasis was noted from the external cervical os.  Verification of no remaining sponges and instruments was performed. The speculum was removed. She was taken out of dorsal lithotomy.  The patient tolerated the procedure well.  Sponge, lap and needle counts were correct x2.  The patient was taken to recovery room in excellent condition.  Conard Novak, MD, [facility] 10/11/2022 8:32 AM

## 2022-10-12 NOTE — Anesthesia Postprocedure Evaluation (Signed)
Anesthesia Post Note  Patient: Sarah Sharp  Procedure(s) Performed: DILATATION AND CURETTAGE /HYSTEROSCOPY/POLYPECTOMY  Patient location during evaluation: PACU Anesthesia Type: General Level of consciousness: awake and alert Pain management: pain level controlled Vital Signs Assessment: post-procedure vital signs reviewed and stable Respiratory status: spontaneous breathing, nonlabored ventilation, respiratory function stable and patient connected to nasal cannula oxygen Cardiovascular status: blood pressure returned to baseline and stable Postop Assessment: no apparent nausea or vomiting Anesthetic complications: no   No notable events documented.   Last Vitals:  Vitals:   10/11/22 0915 10/11/22 0927  BP: 127/60 (!) 124/55  Pulse: 84 84  Resp: 12 16  Temp: 36.6 C 36.6 C  SpO2: 95% 97%    Last Pain:  Vitals:   10/12/22 1002  TempSrc:   PainSc: 0-No pain                 Stephanie Coup

## 2022-10-13 LAB — SURGICAL PATHOLOGY

## 2022-10-14 ENCOUNTER — Encounter: Admission: RE | Payer: Self-pay | Source: Home / Self Care

## 2022-10-14 ENCOUNTER — Ambulatory Visit: Admission: RE | Admit: 2022-10-14 | Payer: 59 | Source: Home / Self Care | Admitting: Gastroenterology

## 2022-10-14 SURGERY — COLONOSCOPY WITH PROPOFOL
Anesthesia: General

## 2022-12-05 NOTE — H&P (Signed)
Preoperative History and Physical   Chief Complaint: Sarah Sharp is a 57 y.o. G0P0000 here for surgical management of postmenopausal bleeding, thickened endometrium.   No significant preoperative concerns.   History of Present Illness: Patient presents for pre-operative appointment.  She presented with postmenopausal bleeding.  She had one day of light spotting in May of this year. Since that time she has had no bleeding. She has had an initial evaluation at [facility] and had a pelvic ultrasound that showed some fibroids and an 8 mm endometrial stripe (see details below).  It was recommended to her to have an endometrial biopsy and she is here for a second opinion regarding this.   Last pap smear 01/2020, NILM, HPV negative   Ultrasound results from 08/02/2022 (report): Location: [place] [facility] at [facility]  Date of Service: 08/02/2022   Indications:Abnormal Uterine Bleeding  Findings:  The uterus is retroverted and measures 5.63 x 4.7 x 4.75 cm.  Echo texture is heterogenous with evidence of focal masses.  Within the uterus there is  a suspected fibroid.  measuring:  Fibroid 1:3.10 x 2.62 x 3.62 cm subserosal   The Endometrium measures 8 mm. With fluid within, no vascular polyp stalk  seen   Right Ovary not well seen  Left Ovary not well seen  Survey of the adnexa demonstrates no adnexal masses.  There is no free fluid in the cul de sac.   Impression:  1. Fibroid seen  2. Irregular endometrium   Recommendations:  1.Clinical correlation with the patient's History and Physical Exam.  2.  Assuming the diagnosis of abnormal uterine bleeding is correct-an 8 mm  endometrium is not significant.  However if the patient is postmenopausal  this may be significant.  3.  Uterine fibroid    On 11/04/2022 she underwent a hysteroscopy, dilation and curettage, and polypectomy.  The resulting pathology was:  - ATYPICAL COMPLEX MUCINOUS AND SYNCYTIAL METAPLASIA.   - BACKGROUND  ENDOMETRIAL POLYP AND INACTIVE ENDOMETRIUM WITH METAPLASTIC       CHANGES.    After conferring with gynecologic oncology, I was advised that I could perform the hysterectomy without gynecologic oncology present in order to map sentinel lymph nodes and perform staging in case of a cancer with the above pathology result.       Proposed surgery: Robot assisted total laparoscopic hysterectomy, bilateral salpingo-oophorectomy, cystoscopy    Past Medical History      Past Medical History:  Diagnosis Date   Anemia 2007   Anxiety 2007   Depression 2007   Fibroid     Heart murmur 1990s   Hyperlipidemia 2007   Hypertension 2007   Thyroid disease 2007      Past Surgical History       Past Surgical History:  Procedure Laterality Date   DILATION AND CURETTAGE, DIAGNOSTIC / THERAPEUTIC   03/2005   COLONOSCOPY   07/19/2017    @ [facility] with Dr. Lemar Livings, INFLAMED SERRATED POLYP, FAVOR A SESSILE SERRATED ADENOMA. TUBULAR ADENOMA.   DILATATION AND CURETTAGE /HYSTEROSCOPY    10/11/2022    [facility], Conard Novak, MD   DILATION AND CURETTAGE OF UTERUS   2007              OB History  Gravida Para Term Preterm AB Living  0 0 0 0 0 0  SAB IAB Ectopic Molar Multiple Live Births   0 0 0 0 0 0  Patient denies any other pertinent gynecologic issues.  Current Outpatient Medications on File Prior to Visit  Medication Sig Dispense Refill   amLODIPine (NORVASC) 10 MG tablet Take 1 tablet (10 mg total) by mouth once daily 90 tablet 3   buPROPion (WELLBUTRIN XL) 150 MG XL tablet TAKE 1 TABLET BY MOUTH ONCE  DAILY 90 tablet 1   cholecalciferol (VITAMIN D3) 2,000 unit capsule Take 2,000 Units by mouth once daily.       levothyroxine (SYNTHROID) 125 MCG tablet Take 1 tablet (125 mcg total) by mouth every morning before breakfast (0630) ON AN EMPTY STOMACH WITH A GLASS OF WATER 30 TO 60 MINUTES BEFORE BREAKFAST 90 tablet 3   lisinopriL-hydroCHLOROthiazide (ZESTORETIC) 10-12.5 mg tablet Take 1  tablet by mouth once daily 90 tablet 3   multivit-minerals/ferrous fum (MULTI VITAMIN ORAL) Take 1 tablet by mouth once daily         nortriptyline (PAMELOR) 10 MG capsule TAKE 2 CAPSULES BY MOUTH AT  BEDTIME 180 capsule 3   omeprazole (PRILOSEC) 40 MG DR capsule Take 40 mg by mouth once daily        No current facility-administered medications on file prior to visit.    Allergies  No Known Allergies     Social History:   reports that she has never smoked. She has never used smokeless tobacco. She reports that she does not drink alcohol and does not use drugs.   Family History        Family History  Problem Relation Name Age of Onset   High blood pressure (Hypertension) Mother Naiah Thebo     Hyperlipidemia (Elevated cholesterol) Mother Jakeisha Brickell     Depression Mother Kadeisha Konieczka     Allergic rhinitis Mother Naryah Platt     Colon cancer Father Gillian Rittenour 51   Rectal cancer Father Jackie Sturm     Bipolar disorder Sister Luisa Hart     Depression Sister [facility]     Depression Sister Tessie Fass     Colon polyps Sister Tessie Fass 30   Uterine cancer Maternal Grandmother  Desanctis          Review of Systems: Noncontributory   PHYSICAL EXAM: Blood pressure 128/78, height 165.1 cm (5\' 5" ), weight (!) 124.6 kg (274 lb 9.6 oz), last menstrual period 01/17/2021. CONSTITUTIONAL: Well-developed, well-nourished female in no acute distress.  HENT:  Normocephalic, atraumatic, External right and left ear normal. Oropharynx is clear and moist EYES: Conjunctivae and EOM are normal. Pupils are equal, round, and reactive to light. No scleral icterus.  NECK: Normal range of motion, supple, no masses SKIN: Skin is warm and dry. No rash noted. Not diaphoretic. No erythema. No pallor. NEUROLGIC: Alert and oriented to person, place, and time. Normal reflexes, muscle tone coordination. No cranial nerve deficit noted. PSYCHIATRIC: Normal mood and affect. Normal behavior. Normal judgment  and thought content. CARDIOVASCULAR: Normal heart rate noted, regular rhythm RESPIRATORY: Effort and breath sounds normal, no problems with respiration noted ABDOMEN: Soft, nontender, nondistended. PELVIC: Deferred MUSCULOSKELETAL: Normal range of motion. No edema and no tenderness. 2+ distal pulses.   Labs: Recent Results  No results found for this or any previous visit (from the past 2 weeks).     Imaging Studies: No results found.   Assessment: 1. Postmenopausal bleeding   2. Thickened endometrium   3. Endometrial polyp       Plan: Patient will undergo surgical management with the above-noted surgery.   The risks of surgery were discussed in detail with the patient including  but not limited to: bleeding which may require transfusion or reoperation; infection which may require antibiotics; injury to surrounding organs which may involve bowel, bladder, ureters ; need for additional procedures including laparoscopy or laparotomy; thromboembolic phenomenon, surgical site problems and other postoperative/anesthesia complications. Likelihood of success in alleviating the patient's condition was discussed. Routine postoperative instructions will be reviewed with the patient and her family in detail after surgery.  The patient concurred with the proposed plan, giving informed written consent for the surgery.   Preoperative prophylactic antibiotics, as indicated, and SCDs ordered on call to the OR.     Return in 31 days (on 01/05/2023) for Post-op incision check.    Attestation Statement:    I personally performed the service. (TP)   Larysa Pall Teola Bradley, MD  [facility] 12/05/2022 9:48 AM

## 2022-12-08 ENCOUNTER — Other Ambulatory Visit: Payer: Self-pay | Admitting: Obstetrics and Gynecology

## 2022-12-21 ENCOUNTER — Encounter
Admission: RE | Admit: 2022-12-21 | Discharge: 2022-12-21 | Disposition: A | Discharge status: Home / Self Care | Payer: 59 | Source: Ambulatory Visit | Attending: Obstetrics and Gynecology | Admitting: Obstetrics and Gynecology

## 2022-12-21 VITALS — Ht 65.0 in | Wt 274.7 lb

## 2022-12-21 DIAGNOSIS — Z01812 Encounter for preprocedural laboratory examination: Secondary | ICD-10-CM

## 2022-12-21 DIAGNOSIS — I10 Essential (primary) hypertension: Secondary | ICD-10-CM

## 2022-12-21 DIAGNOSIS — Z79899 Other long term (current) drug therapy: Secondary | ICD-10-CM

## 2022-12-21 HISTORY — DX: Postmenopausal bleeding: N95.0

## 2022-12-21 HISTORY — DX: Depression, unspecified: F32.A

## 2022-12-21 HISTORY — DX: Leiomyoma of uterus, unspecified: D25.9

## 2022-12-21 HISTORY — DX: Polyp of corpus uteri: N84.0

## 2022-12-21 HISTORY — DX: Abnormal findings on diagnostic imaging of other specified body structures: R93.89

## 2022-12-21 NOTE — Patient Instructions (Signed)
Your procedure is scheduled on:12-29-22 Thursday Report to the Registration Desk on the 1st floor of the Medical Mall.Then proceed to the 2nd floor Surgery Desk To find out your arrival time, please call [phone] between 1PM - 3PM on:12-28-22 Wednesday If your arrival time is 6:00 am, do not arrive before that time as the Medical Mall entrance doors do not open until 6:00 am.  REMEMBER: Instructions that are not followed completely may result in serious medical risk, up to and including death; or upon the discretion of your surgeon and anesthesiologist your surgery may need to be rescheduled.  Do not eat food OR drink any liquids after midnight the night before surgery.  No gum chewing or hard candies.  One week prior to surgery:Stop NOW (12-21-22) Stop Anti-inflammatories (NSAIDS) such as Advil, Aleve, Ibuprofen, Motrin, Naproxen, Naprosyn and Aspirin based products such as Excedrin, Goody's Powder, BC Powder. Stop ANY OVER THE COUNTER supplements until after surgery (Vitamin D, Multivitamin)  You may however, continue to take Tylenol if needed for pain up until the day of surgery.  Continue taking all of your other prescription medications up until the day of surgery.  ON THE DAY OF SURGERY ONLY TAKE THESE MEDICATIONS WITH SIPS OF WATER: -buPROPion (WELLBUTRIN XL)  -levothyroxine (SYNTHROID, LEVOTHROID)   No Alcohol for 24 hours before or after surgery.  No Smoking including e-cigarettes for 24 hours before surgery.  No chewable tobacco products for at least 6 hours before surgery.  No nicotine patches on the day of surgery.  Do not use any "recreational" drugs for at least a week (preferably 2 weeks) before your surgery.  Please be advised that the combination of cocaine and anesthesia may have negative outcomes, up to and including death. If you test positive for cocaine, your surgery will be cancelled.  On the morning of surgery brush your teeth with toothpaste and water,  you may rinse your mouth with mouthwash if you wish. Do not swallow any toothpaste or mouthwash.  Use CHG Soap as directed on instruction sheet.  Do not wear jewelry, make-up, hairpins, clips or nail polish.  For welded (permanent) jewelry: bracelets, anklets, waist bands, etc.  Please have this removed prior to surgery.  If it is not removed, there is a chance that hospital personnel will need to cut it off on the day of surgery.  Do not wear lotions, powders, or perfumes.   Do not shave body hair from the neck down 48 hours before surgery.  Contact lenses, hearing aids and dentures may not be worn into surgery.  Do not bring valuables to the hospital. [facility] is not responsible for any missing/lost belongings or valuables.   Notify your doctor if there is any change in your medical condition (cold, fever, infection).  Wear comfortable clothing (specific to your surgery type) to the hospital.  After surgery, you can help prevent lung complications by doing breathing exercises.  Take deep breaths and cough every 1-2 hours. Your doctor may order a device called an Incentive Spirometer to help you take deep breaths. When coughing or sneezing, hold a pillow firmly against your incision with both hands. This is called "splinting." Doing this helps protect your incision. It also decreases belly discomfort.  If you are being admitted to the hospital overnight, leave your suitcase in the car. After surgery it may be brought to your room.  In case of increased patient census, it may be necessary for you, the patient, to continue your postoperative care  in the Same Day Surgery department.  If you are being discharged the day of surgery, you will not be allowed to drive home. You will need a responsible individual to drive you home and stay with you for 24 hours after surgery.   If you are taking public transportation, you will need to have a responsible individual with you.  Please call  the Pre-admissions Testing Dept. at [phone] if you have any questions about these instructions.  Surgery Visitation Policy:  Patients having surgery or a procedure may have two visitors.  Children under the age of 47 must have an adult with them who is not the patient.     Preparing for Surgery with CHLORHEXIDINE GLUCONATE (CHG) Soap  Chlorhexidine Gluconate (CHG) Soap  o An antiseptic cleaner that kills germs and bonds with the skin to continue killing germs even after washing  o Used for showering the night before surgery and morning of surgery  Before surgery, you can play an important role by reducing the number of germs on your skin.  CHG (Chlorhexidine gluconate) soap is an antiseptic cleanser which kills germs and bonds with the skin to continue killing germs even after washing.  Please do not use if you have an allergy to CHG or antibacterial soaps. If your skin becomes reddened/irritated stop using the CHG.  1. Shower the NIGHT BEFORE SURGERY and the MORNING OF SURGERY with CHG soap.  2. If you choose to wash your hair, wash your hair first as usual with your normal shampoo.  3. After shampooing, rinse your hair and body thoroughly to remove the shampoo.  4. Use CHG as you would any other liquid soap. You can apply CHG directly to the skin and wash gently with a scrungie or a clean washcloth.  5. Apply the CHG soap to your body only from the neck down. Do not use on open wounds or open sores. Avoid contact with your eyes, ears, mouth, and genitals (private parts). Wash face and genitals (private parts) with your normal soap.  6. Wash thoroughly, paying special attention to the area where your surgery will be performed.  7. Thoroughly rinse your body with warm water.  8. Do not shower/wash with your normal soap after using and rinsing off the CHG soap.  9. Pat yourself dry with a clean towel.  10. Wear clean pajamas to bed the night before surgery.  12. Place  clean sheets on your bed the night of your first shower and do not sleep with pets.  13. Shower again with the CHG soap on the day of surgery prior to arriving at the hospital.  14. Do not apply any deodorants/lotions/powders.  15. Please wear clean clothes to the hospital.

## 2022-12-22 ENCOUNTER — Encounter
Admission: RE | Admit: 2022-12-22 | Discharge: 2022-12-22 | Disposition: A | Discharge status: Home / Self Care | Payer: 59 | Source: Ambulatory Visit | Attending: Obstetrics and Gynecology | Admitting: Obstetrics and Gynecology

## 2022-12-22 DIAGNOSIS — N950 Postmenopausal bleeding: Secondary | ICD-10-CM

## 2022-12-22 DIAGNOSIS — Z01812 Encounter for preprocedural laboratory examination: Secondary | ICD-10-CM | POA: Diagnosis present

## 2022-12-22 DIAGNOSIS — N8500 Endometrial hyperplasia, unspecified: Secondary | ICD-10-CM | POA: Diagnosis not present

## 2022-12-22 DIAGNOSIS — R9389 Abnormal findings on diagnostic imaging of other specified body structures: Secondary | ICD-10-CM

## 2022-12-22 DIAGNOSIS — Z79899 Other long term (current) drug therapy: Secondary | ICD-10-CM

## 2022-12-22 DIAGNOSIS — N840 Polyp of corpus uteri: Secondary | ICD-10-CM

## 2022-12-22 DIAGNOSIS — I10 Essential (primary) hypertension: Secondary | ICD-10-CM | POA: Diagnosis not present

## 2022-12-22 LAB — CBC
HCT: 40.6 % (ref 36.0–46.0)
Hemoglobin: 12.8 g/dL (ref 12.0–15.0)
MCH: 27.4 pg (ref 26.0–34.0)
MCHC: 31.5 g/dL (ref 30.0–36.0)
MCV: 86.8 fL (ref 80.0–100.0)
Platelets: 371 10*3/uL (ref 150–400)
RBC: 4.68 MIL/uL (ref 3.87–5.11)
RDW: 13.2 % (ref 11.5–15.5)
WBC: 7.2 10*3/uL (ref 4.0–10.5)
nRBC: 0 % (ref 0.0–0.2)

## 2022-12-22 LAB — BASIC METABOLIC PANEL
Anion gap: 10 (ref 5–15)
BUN: 15 mg/dL (ref 6–20)
CO2: 25 mmol/L (ref 22–32)
Calcium: 9.1 mg/dL (ref 8.9–10.3)
Chloride: 102 mmol/L (ref 98–111)
Creatinine, Ser: 0.9 mg/dL (ref 0.44–1.00)
GFR, Estimated: >60 mL/min (ref >60)
Glucose, Bld: 88 mg/dL (ref 70–99)
Potassium: 3.9 mmol/L (ref 3.5–5.1)
Sodium: 137 mmol/L (ref 135–145)

## 2022-12-22 LAB — TYPE AND SCREEN
ABO/RH(D): O POS
Antibody Screen: NEGATIVE

## 2022-12-29 ENCOUNTER — Other Ambulatory Visit: Payer: Self-pay

## 2022-12-29 ENCOUNTER — Encounter: Payer: Self-pay | Admitting: Obstetrics and Gynecology

## 2022-12-29 ENCOUNTER — Ambulatory Visit: Payer: 59 | Admitting: Certified Registered"

## 2022-12-29 ENCOUNTER — Ambulatory Visit
Admission: RE | Admit: 2022-12-29 | Discharge: 2022-12-29 | Disposition: A | Discharge status: Home / Self Care | Payer: 59 | Attending: Obstetrics and Gynecology | Admitting: Obstetrics and Gynecology

## 2022-12-29 ENCOUNTER — Encounter
Admission: RE | Disposition: A | Discharge status: Home / Self Care | Payer: Self-pay | Source: Home / Self Care | Attending: Obstetrics and Gynecology

## 2022-12-29 DIAGNOSIS — N950 Postmenopausal bleeding: Secondary | ICD-10-CM | POA: Diagnosis present

## 2022-12-29 DIAGNOSIS — Z79890 Hormone replacement therapy (postmenopausal): Secondary | ICD-10-CM | POA: Insufficient documentation

## 2022-12-29 DIAGNOSIS — F32A Depression, unspecified: Secondary | ICD-10-CM | POA: Diagnosis not present

## 2022-12-29 DIAGNOSIS — D259 Leiomyoma of uterus, unspecified: Secondary | ICD-10-CM | POA: Diagnosis present

## 2022-12-29 DIAGNOSIS — Z79899 Other long term (current) drug therapy: Secondary | ICD-10-CM | POA: Diagnosis not present

## 2022-12-29 DIAGNOSIS — N72 Inflammatory disease of cervix uteri: Secondary | ICD-10-CM | POA: Insufficient documentation

## 2022-12-29 DIAGNOSIS — E66813 Obesity, class 3: Secondary | ICD-10-CM | POA: Diagnosis not present

## 2022-12-29 DIAGNOSIS — N840 Polyp of corpus uteri: Secondary | ICD-10-CM | POA: Diagnosis present

## 2022-12-29 DIAGNOSIS — I10 Essential (primary) hypertension: Secondary | ICD-10-CM | POA: Diagnosis not present

## 2022-12-29 DIAGNOSIS — F419 Anxiety disorder, unspecified: Secondary | ICD-10-CM | POA: Insufficient documentation

## 2022-12-29 DIAGNOSIS — R9389 Abnormal findings on diagnostic imaging of other specified body structures: Secondary | ICD-10-CM | POA: Diagnosis not present

## 2022-12-29 DIAGNOSIS — N838 Other noninflammatory disorders of ovary, fallopian tube and broad ligament: Secondary | ICD-10-CM | POA: Insufficient documentation

## 2022-12-29 DIAGNOSIS — K219 Gastro-esophageal reflux disease without esophagitis: Secondary | ICD-10-CM | POA: Diagnosis not present

## 2022-12-29 DIAGNOSIS — N736 Female pelvic peritoneal adhesions (postinfective): Secondary | ICD-10-CM | POA: Diagnosis not present

## 2022-12-29 DIAGNOSIS — E039 Hypothyroidism, unspecified: Secondary | ICD-10-CM | POA: Insufficient documentation

## 2022-12-29 DIAGNOSIS — N83202 Unspecified ovarian cyst, left side: Secondary | ICD-10-CM | POA: Diagnosis not present

## 2022-12-29 DIAGNOSIS — N8003 Adenomyosis of the uterus: Secondary | ICD-10-CM | POA: Insufficient documentation

## 2022-12-29 DIAGNOSIS — N8500 Endometrial hyperplasia, unspecified: Secondary | ICD-10-CM | POA: Diagnosis not present

## 2022-12-29 DIAGNOSIS — D251 Intramural leiomyoma of uterus: Secondary | ICD-10-CM | POA: Insufficient documentation

## 2022-12-29 DIAGNOSIS — N83201 Unspecified ovarian cyst, right side: Secondary | ICD-10-CM | POA: Insufficient documentation

## 2022-12-29 HISTORY — PX: CYSTOSCOPY: SHX5120

## 2022-12-29 HISTORY — PX: ROBOTIC ASSISTED LAPAROSCOPIC LYSIS OF ADHESION: SHX6080

## 2022-12-29 HISTORY — PX: ROBOTIC ASSISTED TOTAL HYSTERECTOMY WITH BILATERAL SALPINGO OOPHERECTOMY: SHX6086

## 2022-12-29 LAB — ABO/RH: ABO/RH(D): O POS

## 2022-12-29 LAB — POCT PREGNANCY, URINE: Preg Test, Ur: NEGATIVE

## 2022-12-29 SURGERY — HYSTERECTOMY, TOTAL, ROBOT-ASSISTED, LAPAROSCOPIC, WITH BILATERAL SALPINGO-OOPHORECTOMY
Anesthesia: General

## 2022-12-29 MED ORDER — SUGAMMADEX SODIUM 200 MG/2ML IV SOLN
INTRAVENOUS | Status: DC | PRN
  Administered 2022-12-29: 250 mg via INTRAVENOUS

## 2022-12-29 MED ORDER — POVIDONE-IODINE 10 % EX SWAB
2.0000 | Freq: Once | CUTANEOUS | Status: AC
  Administered 2022-12-29: 2 via TOPICAL

## 2022-12-29 MED ORDER — SUCCINYLCHOLINE CHLORIDE 200 MG/10ML IV SOSY
PREFILLED_SYRINGE | INTRAVENOUS | Status: DC | PRN
  Administered 2022-12-29: 100 mg via INTRAVENOUS

## 2022-12-29 MED ORDER — PHENYLEPHRINE HCL-NACL 20-0.9 MG/250ML-% IV SOLN
INTRAVENOUS | Status: DC | PRN
  Administered 2022-12-29: 25 ug/min via INTRAVENOUS

## 2022-12-29 MED ORDER — PHENYLEPHRINE HCL-NACL 20-0.9 MG/250ML-% IV SOLN
INTRAVENOUS | Status: AC
  Filled 2022-12-29: qty 250

## 2022-12-29 MED ORDER — PROPOFOL 10 MG/ML IV BOLUS
INTRAVENOUS | Status: DC | PRN
  Administered 2022-12-29: 200 mg via INTRAVENOUS

## 2022-12-29 MED ORDER — LIDOCAINE HCL (CARDIAC) PF 100 MG/5ML IV SOSY
PREFILLED_SYRINGE | INTRAVENOUS | Status: DC | PRN
  Administered 2022-12-29: 100 mg via INTRAVENOUS

## 2022-12-29 MED ORDER — GLYCOPYRROLATE 0.2 MG/ML IJ SOLN
INTRAMUSCULAR | Status: DC | PRN
  Administered 2022-12-29: .2 mg via INTRAVENOUS

## 2022-12-29 MED ORDER — BUPIVACAINE HCL 0.5 % IJ SOLN
INTRAMUSCULAR | Status: DC | PRN
  Administered 2022-12-29: 8 mL

## 2022-12-29 MED ORDER — ORAL CARE MOUTH RINSE: 15.0000 mL | Freq: Once | OROMUCOSAL | Status: AC

## 2022-12-29 MED ORDER — OXYCODONE HCL 5 MG PO TABS
ORAL_TABLET | ORAL | Status: AC
  Filled 2022-12-29: qty 1

## 2022-12-29 MED ORDER — HYDROMORPHONE HCL 1 MG/ML IJ SOLN
INTRAMUSCULAR | Status: DC | PRN
  Administered 2022-12-29: 1 mg via INTRAVENOUS

## 2022-12-29 MED ORDER — SODIUM CHLORIDE 0.9 % IV SOLN: INTRAVENOUS | Status: AC

## 2022-12-29 MED ORDER — BUPIVACAINE HCL (PF) 0.5 % IJ SOLN
INTRAMUSCULAR | Status: AC
  Filled 2022-12-29: qty 30

## 2022-12-29 MED ORDER — CEFAZOLIN SODIUM-DEXTROSE 2-4 GM/100ML-% IV SOLN
INTRAVENOUS | Status: AC
  Filled 2022-12-29: qty 100

## 2022-12-29 MED ORDER — KETAMINE HCL 10 MG/ML IJ SOLN
INTRAMUSCULAR | Status: DC | PRN
  Administered 2022-12-29: 20 mg via INTRAVENOUS

## 2022-12-29 MED ORDER — DEXAMETHASONE SODIUM PHOSPHATE 10 MG/ML IJ SOLN
INTRAMUSCULAR | Status: DC | PRN
  Administered 2022-12-29: 10 mg via INTRAVENOUS

## 2022-12-29 MED ORDER — DROPERIDOL 2.5 MG/ML IJ SOLN
INTRAMUSCULAR | Status: AC
Start: 2022-12-29 — End: ?
  Filled 2022-12-29: qty 2

## 2022-12-29 MED ORDER — HYDROMORPHONE HCL 1 MG/ML IJ SOLN
INTRAMUSCULAR | Status: AC
  Filled 2022-12-29: qty 1

## 2022-12-29 MED ORDER — OXYCODONE-ACETAMINOPHEN 5-325 MG PO TABS: 1.0000 | ORAL_TABLET | Freq: Four times a day (QID) | ORAL | 0 refills | Status: DC | PRN

## 2022-12-29 MED ORDER — MIDAZOLAM HCL 2 MG/2ML IJ SOLN
INTRAMUSCULAR | Status: DC | PRN
  Administered 2022-12-29: 2 mg via INTRAVENOUS

## 2022-12-29 MED ORDER — ROCURONIUM BROMIDE 100 MG/10ML IV SOLN
INTRAVENOUS | Status: DC | PRN
  Administered 2022-12-29: 10 mg via INTRAVENOUS
  Administered 2022-12-29: 50 mg via INTRAVENOUS
  Administered 2022-12-29: 40 mg via INTRAVENOUS
  Administered 2022-12-29: 30 mg via INTRAVENOUS

## 2022-12-29 MED ORDER — IBUPROFEN 600 MG PO TABS: 600.0000 mg | ORAL_TABLET | Freq: Four times a day (QID) | ORAL | 0 refills | Status: DC

## 2022-12-29 MED ORDER — CEFAZOLIN SODIUM-DEXTROSE 2-4 GM/100ML-% IV SOLN
2.0000 g | INTRAVENOUS | Status: AC
  Administered 2022-12-29: 2 g via INTRAVENOUS
  Administered 2022-12-29: 1 g via INTRAVENOUS

## 2022-12-29 MED ORDER — CHLORHEXIDINE GLUCONATE 0.12 % MT SOLN
OROMUCOSAL | Status: AC
  Filled 2022-12-29: qty 15

## 2022-12-29 MED ORDER — DROPERIDOL 2.5 MG/ML IJ SOLN
0.6250 mg | Freq: Once | INTRAMUSCULAR | Status: AC
  Administered 2022-12-29: 0.625 mg via INTRAVENOUS
  Filled 2022-12-29: qty 2

## 2022-12-29 MED ORDER — KETAMINE HCL 50 MG/5ML IJ SOSY
PREFILLED_SYRINGE | INTRAMUSCULAR | Status: AC
  Filled 2022-12-29: qty 5

## 2022-12-29 MED ORDER — FENTANYL CITRATE (PF) 100 MCG/2ML IJ SOLN
INTRAMUSCULAR | Status: DC | PRN
  Administered 2022-12-29 (×2): 50 ug via INTRAVENOUS

## 2022-12-29 MED ORDER — OXYCODONE HCL 5 MG/5ML PO SOLN: 5.0000 mg | Freq: Once | ORAL | Status: AC | PRN

## 2022-12-29 MED ORDER — ONDANSETRON 4 MG PO TBDP: 4.0000 mg | ORAL_TABLET | Freq: Four times a day (QID) | ORAL | 0 refills | Status: AC | PRN

## 2022-12-29 MED ORDER — OXYCODONE HCL 5 MG PO TABS
5.0000 mg | ORAL_TABLET | Freq: Once | ORAL | Status: AC | PRN
  Administered 2022-12-29: 5 mg via ORAL

## 2022-12-29 MED ORDER — CHLORHEXIDINE GLUCONATE 0.12 % MT SOLN
15.0000 mL | Freq: Once | OROMUCOSAL | Status: AC
  Administered 2022-12-29: 15 mL via OROMUCOSAL

## 2022-12-29 MED ORDER — HYDROMORPHONE HCL 1 MG/ML IJ SOLN
0.2500 mg | INTRAMUSCULAR | Status: DC | PRN
  Administered 2022-12-29 (×3): 0.25 mg via INTRAVENOUS

## 2022-12-29 MED ORDER — MIDAZOLAM HCL 2 MG/2ML IJ SOLN
INTRAMUSCULAR | Status: AC
  Filled 2022-12-29: qty 2

## 2022-12-29 MED ORDER — DEXMEDETOMIDINE HCL IN NACL 200 MCG/50ML IV SOLN
INTRAVENOUS | Status: DC | PRN
  Administered 2022-12-29: 12 ug via INTRAVENOUS

## 2022-12-29 MED ORDER — ACETAMINOPHEN 10 MG/ML IV SOLN
INTRAVENOUS | Status: DC | PRN
  Administered 2022-12-29: 1000 mg via INTRAVENOUS

## 2022-12-29 MED ORDER — ONDANSETRON HCL 4 MG/2ML IJ SOLN
INTRAMUSCULAR | Status: DC | PRN
  Administered 2022-12-29 (×2): 4 mg via INTRAVENOUS

## 2022-12-29 MED ORDER — FENTANYL CITRATE (PF) 100 MCG/2ML IJ SOLN
INTRAMUSCULAR | Status: AC
  Filled 2022-12-29: qty 2

## 2022-12-29 SURGICAL SUPPLY — 61 items
APPLICATOR ARISTA FLEXITIP XL (MISCELLANEOUS) IMPLANT
BAG URINE DRAIN 2000ML AR STRL (UROLOGICAL SUPPLIES) ×3 IMPLANT
BLADE SURG SZ11 CARB STEEL (BLADE) ×3 IMPLANT
CANNULA CAP OBTURATR AIRSEAL 8 (CAP) ×3 IMPLANT
CATH URTH 16FR FL 2W BLN LF (CATHETERS) ×3 IMPLANT
COVER TIP SHEARS 8 DVNC (MISCELLANEOUS) ×3 IMPLANT
DERMABOND ADVANCED .7 DNX12 (GAUZE/BANDAGES/DRESSINGS) ×3 IMPLANT
DRAPE ARM DVNC X/XI (DISPOSABLE) ×12 IMPLANT
DRAPE COLUMN DVNC XI (DISPOSABLE) ×3 IMPLANT
DRAPE UNDER BUTTOCK W/FLU (DRAPES) ×3 IMPLANT
DRIVER NDL MEGA SUTCUT DVNCXI (INSTRUMENTS) ×3 IMPLANT
DRIVER NDLE MEGA SUTCUT DVNCXI (INSTRUMENTS) ×3 IMPLANT
ELECT REM PT RETURN 9FT ADLT (ELECTROSURGICAL) ×3
ELECTRODE REM PT RTRN 9FT ADLT (ELECTROSURGICAL) ×3 IMPLANT
FORCEPS BPLR FENES DVNC XI (FORCEP) ×3 IMPLANT
FORCEPS BPLR R/ABLATION 8 DVNC (INSTRUMENTS) ×3 IMPLANT
GAUZE 4X4 16PLY ~~LOC~~+RFID DBL (SPONGE) ×3 IMPLANT
GLOVE BIO SURGEON STRL SZ7 (GLOVE) ×9 IMPLANT
GLOVE BIOGEL PI IND STRL 7.5 (GLOVE) ×9 IMPLANT
GOWN STRL REUS W/ TWL LRG LVL3 (GOWN DISPOSABLE) ×9 IMPLANT
GOWN STRL REUS W/ TWL XL LVL3 (GOWN DISPOSABLE) ×3 IMPLANT
HEMOSTAT ARISTA ABSORB 3G PWDR (HEMOSTASIS) IMPLANT
IRRIGATION STRYKERFLOW (MISCELLANEOUS) IMPLANT
IRRIGATOR STRYKERFLOW (MISCELLANEOUS)
IRRIGATOR SUCT 8 DISP DVNC XI (IRRIGATION / IRRIGATOR) IMPLANT
IV LACTATED RINGERS 1000ML (IV SOLUTION) ×3 IMPLANT
IV NS 1000ML BAXH (IV SOLUTION) ×3 IMPLANT
KIT PINK PAD W/HEAD ARE REST (MISCELLANEOUS) ×3
KIT PINK PAD W/HEAD ARM REST (MISCELLANEOUS) ×3 IMPLANT
KIT TURNOVER CYSTO (KITS) ×3 IMPLANT
LABEL OR SOLS (LABEL) ×3 IMPLANT
MANIFOLD NEPTUNE II (INSTRUMENTS) ×3 IMPLANT
MANIPULATOR VCARE STD CRV RETR (MISCELLANEOUS) IMPLANT
NDL HYPO 22X1.5 SAFETY MO (MISCELLANEOUS) ×3 IMPLANT
NEEDLE HYPO 22X1.5 SAFETY MO (MISCELLANEOUS) ×3 IMPLANT
NS IRRIG 500ML POUR BTL (IV SOLUTION) ×3 IMPLANT
OBTURATOR OPTICAL STND 8 DVNC (TROCAR) ×3
OBTURATOR OPTICALSTD 8 DVNC (TROCAR) ×3 IMPLANT
OCCLUDER COLPOPNEUMO (BALLOONS) ×3 IMPLANT
PACK GYN LAPAROSCOPIC (MISCELLANEOUS) ×3 IMPLANT
PAD PREP OB/GYN DISP 24X41 (PERSONAL CARE ITEMS) ×3 IMPLANT
SCISSORS MNPLR CVD DVNC XI (INSTRUMENTS) ×3 IMPLANT
SCRUB CHG 4% DYNA-HEX 4OZ (MISCELLANEOUS) ×3 IMPLANT
SEAL UNIV 5-12 XI (MISCELLANEOUS) ×9 IMPLANT
SEALER VESSEL EXT DVNC XI (MISCELLANEOUS) IMPLANT
SET CYSTO W/LG BORE CLAMP LF (SET/KITS/TRAYS/PACK) ×3 IMPLANT
SET TUBE FILTERED XL AIRSEAL (SET/KITS/TRAYS/PACK) ×3 IMPLANT
SOL ELECTROSURG ANTI STICK (MISCELLANEOUS) ×3
SOL PREP PVP 2OZ (MISCELLANEOUS) ×3
SOLUTION ELECTROSURG ANTI STCK (MISCELLANEOUS) ×3 IMPLANT
SOLUTION PREP PVP 2OZ (MISCELLANEOUS) ×3 IMPLANT
SPONGE T-LAP 18X18 ~~LOC~~+RFID (SPONGE) IMPLANT
SURGILUBE 2OZ TUBE FLIPTOP (MISCELLANEOUS) ×3 IMPLANT
SUT MNCRL 4-0 27XMFL (SUTURE) ×3
SUT STRATA PDS 0 30 CT-2.5 (SUTURE) ×3 IMPLANT
SUT VIC AB 0 CT2 27 (SUTURE) ×3 IMPLANT
SUTURE MNCRL 4-0 27XMF (SUTURE) ×3 IMPLANT
SYR 10ML LL (SYRINGE) ×3 IMPLANT
SYR 50ML LL SCALE MARK (SYRINGE) ×3 IMPLANT
TRAP FLUID SMOKE EVACUATOR (MISCELLANEOUS) ×3 IMPLANT
WATER STERILE IRR 500ML POUR (IV SOLUTION) ×3 IMPLANT

## 2022-12-29 NOTE — Interval H&P Note (Signed)
History and Physical Interval Note:  12/29/2022 9:17 AM  Sarah Sharp  has presented today for surgery, with the diagnosis of endometrial hyperplasia.  The various methods of treatment have been discussed with the patient and family. After consideration of risks, benefits and other options for treatment, the patient has consented to  Procedure(s): XI ROBOTIC ASSISTED TOTAL HYSTERECTOMY WITH BILATERAL SALPINGO OOPHORECTOMY (Bilateral) CYSTOSCOPY (N/A) as a surgical intervention.  The patient's history has been reviewed, patient examined, no change in status, stable for surgery.  I have reviewed the patient's chart and labs.  Questions were answered to the patient's satisfaction.    Thomasene Mohair, MD, [facility] 12/29/2022 9:17 AM

## 2022-12-29 NOTE — Transfer of Care (Signed)
Immediate Anesthesia Transfer of Care Note  Patient: Sarah Sharp  Procedure(s) Performed: XI ROBOTIC ASSISTED TOTAL HYSTERECTOMY WITH BILATERAL SALPINGO OOPHORECTOMY (Bilateral) CYSTOSCOPY XI ROBOTIC ASSISTED LAPAROSCOPIC LYSIS OF ADHESION  Patient Location: PACU  Anesthesia Type:General  Level of Consciousness: awake, drowsy, and patient cooperative  Airway & Oxygen Therapy: Patient Spontanous Breathing and Patient connected to face mask oxygen  Post-op Assessment: Report given to RN and Post -op Vital signs reviewed and stable  Post vital signs: Reviewed and stable  Last Vitals:  Vitals Value Taken Time  BP 108/83 12/29/22 1304  Temp 36 C 12/29/22 1302  Pulse 83 12/29/22 1308  Resp 17 12/29/22 1308  SpO2 100 % 12/29/22 1308  Vitals shown include unfiled device data.  Last Pain:  Vitals:   12/29/22 0825  TempSrc: Temporal  PainSc: 0-No pain         Complications: No notable events documented.

## 2022-12-29 NOTE — Anesthesia Procedure Notes (Addendum)
Procedure Name: Intubation Date/Time: 12/29/2022 9:49 AM  Performed by: Mohammed Kindle, CRNAPre-anesthesia Checklist: Patient identified, Emergency Drugs available, Suction available and Patient being monitored Patient Re-evaluated:Patient Re-evaluated prior to induction Oxygen Delivery Method: Circle system utilized Preoxygenation: Pre-oxygenation with 100% oxygen Induction Type: IV induction Ventilation: Mask ventilation without difficulty Laryngoscope Size: McGrath and 3 Grade View: Grade I Tube type: Oral Tube size: 6.5 mm Number of attempts: 1 Airway Equipment and Method: Stylet Placement Confirmation: ETT inserted through vocal cords under direct vision, positive ETCO2 and breath sounds checked- equal and bilateral Secured at: 21 cm Tube secured with: Tape Dental Injury: Teeth and Oropharynx as per pre-operative assessment

## 2022-12-29 NOTE — Op Note (Signed)
Operative Note    Name: Sarah Sharp  Date of Service: 12/29/2022   DOB: Dec 30, 1965  MRN: [id number]    Pre-Operative Diagnosis:  Postmenopausal bleeding Thickened endometrium Endometrial polyp Uterine leiomyoma Endometrial atypical metaplasia  Post-Operative Diagnosis:  1.  Postmenopausal bleeding 2.  Thickened endometrium 3.   Endometrial polyp 4.   Uterine leiomyoma 5.  Endometrial atypical metaplasia  Procedures:  1. Robot assisted Total Laparoscopic Hysterectomy, bilateral salpingo-oophorectomy 2. Lysis of adhesions (> 30 minutes)  3. Cystoscopy  Primary Surgeon: Thomasene Mohair, MD  Assistant Surgeon: Christeen Douglas, MD   EBL: 25 mL   IVF: 1,000 mL   Urine output: 400 mL clear urine at end of procedure  Specimens: Uterus with fibroid, cervix, and bilateral fallopian tubes and ovaries  Drains: none  Complications: None   Disposition: PACU   Condition: Stable   Findings:  1. enlarged uterus with apparent fundal fibroid 2. normal appearing ovaries, bilateral fallopian tubes 3. Significant adhesions of sigmoid colon to left pelvic sidewall and left pelvic brin 4. At end of case on cystoscopy, bladder without wall defect. Efflux of clear urine from the bilateral ureteral orifices.  Procedure Summary:  The patient was taken to the operating room where general anesthesia was administered and found to be adequate. She was placed in the dorsal supine lithotomy position in [place] stirrups and prepped and draped in the usual, sterile fashion. After a timeout was called an indwelling catheter was placed in her bladder.  A sterile speculum was placed in her vagina.  The anterior lip of the cervix was grasped with the single-tooth tenaculum.  The cervix was serially dilated to an 11 Pratt dilator.  The medium Vcare device was placed in accordance to the manufacturer's recommendations.  The tenaculum and speculum were removed.   Attention was turned to the abdomen  where after injection of local anesthetic, an 8 mm infraumbilical incision was made with the scalpel. Entry into the abdomen was obtained via Optiview trocar technique (a blunt entry technique with camera visualization through the obturator upon entry). Verification of entry into the abdomen was obtained using opening pressures. The abdomen was insufflated with CO2. The camera was introduced through the trocar with verification of atraumatic entry.  Right and left abdominal entry sites were created after injection of local anesthetic about 8 cm lateral to the umbilical port in accordance with the Intuitive manufacturer's recommendations.  The port sites were 8 mm.  The intuitive trochars were introduced under intra-abdominal camera visualization without difficulty   The XI robot was docked on the patient's left.  Clearance was verified from the patient's legs.  Through the umbilical port the camera was placed.  Through the port attached to arm 4 the monopolar scissors were placed.  Through the port attached to arm 2 the forced bipolar forceps were was placed.     An inspection was undertaken of the pelvis with the above-noted findings.  Attention was turned to the left pelvic sidewall after verifying no obvious intra-abdominal pathology.  Of note, externally the patient seemed to have a large defect in the abdominal wall as evidenced by protrusion of a large amount of tissue starting approximately 4 cm cephalad to the umbilicus.  Laparoscopically there was no evidence of hernia.  The abdominal wall was difficult to visualize due to stereotactic interference.  At this time the adhesions of the bowel carefully taken down in order to expose the left ovary, fallopian tube, and infundibulopelvic ligament.  This required great amount  of time as the adhesions were thick and spanned a large area.  The adhesions were taken down without difficulty.  The anterior lower uterine segment was identified and the [place] ring was  identified.  The peritoneum was opened at this point and the bladder flap was created without difficulty.  The right round ligament was grasped near the sidewall and cauterized and transected.  The peritoneum between the opening and the round ligament on the right and the bladder flap was opened.  The peritoneum extending from the right round ligament was opened extending cephalad and along the right infundibulopelvic ligament.  The retroperitoneal space was opened carefully in order to identify the ureter which was found to be well away from the operative area of interest.  This allowed for a safe opening of the posterior broad ligament and the peritoneum was dissected off the medial side of the infundibulopelvic ligament.  With the ureter well away from this area, the vessel sealer was used to cauterize and transect the infundibulopelvic ligament.  Hemostasis was noted.  The posterior broad ligament was dissected down to the level of the internal cervical os.  The right uterine artery was skeletonized, cauterized, and transected.  This was further dissected off the cervix.  Same procedure was carried out on the left side without difficulty.  Specifically, the ureter was identified and the retroperitoneal space so that a window could be created on the posterior broad ligament and the infundibulopelvic ligament could be skeletonized without difficulty.  The colpotomy was performed using monopolar electrocautery in a circumferential fashion following the KOH ring.  The uterus and fallopian tubes and cervix were removed through the vagina.   Closure of the vaginal cuff was undertaken using the V-lock stitch in a running fashion.  All vascular pedicles were inspected and found to be hemostatic.  The pressure was dropped to 5 mmHg to verify ongoing hemostasis.  Arista was placed in the retroperitoneal space and along the vaginal cuff closure to ensure ongoing hemostasis.  All instruments removed from the robotic  ports.  The robot was undocked from the patient.  The abdomen was then desufflated of CO2 with the aid of 5 deep breaths from anesthesia.  All trochars were then removed.  All skin incisions were closed using 4-0 Vicryl in a subcuticular fashion and reinforced using surgical skin glue.   Cystoscopy was undertaken at this point. The Foley catheter was removed and the 70 cystoscope was gently introduced through the urethra. The bladder survey was undertaken with efflux of urine from both orifices noted. There were no defects noted in the bladder wall. The cystoscope was utilized to fully empty the bladder.  A sterile speculum exam was performed and the vaginal cuff was verified to be hemostatic.  There was a small, first-degree laceration, in the vagina that was hemostatic at this point and was not repaired.  The assistant performed a significant amount of work during the procedure.  Specifically, she placed the uterine manipulator and Foley catheter.  She was responsible for uterine manipulation throughout the procedure.  She also removed the uterus and the rest of the specimen from the vagina.  She performed the cystoscopy and verification of hemostasis at the vaginal cuff.  The patient tolerated the procedure well.  Sponge, lap, needle, and instrument counts were correct x 2.  VTE prophylaxis: SCDs. Antibiotic prophylaxis: Ancef 3 grams IV prior to skin incision.  The assistant was an MD due to the lack of other available qualified assistants for  this procedure.  She was awakened in the operating room and was taken to the PACU in stable condition.   Thomasene Mohair, MD, [facility] 12/29/2022 12:52 PM

## 2022-12-29 NOTE — Anesthesia Preprocedure Evaluation (Addendum)
Anesthesia Evaluation  Patient identified by MRN, date of birth, ID band Patient awake    Reviewed: Allergy & Precautions, NPO status , Patient's Chart, lab work & pertinent test results  History of Anesthesia Complications Negative for: history of anesthetic complications  Airway Mallampati: III  TM Distance: >3 FB Neck ROM: full    Dental no notable dental hx.    Pulmonary neg pulmonary ROS   Pulmonary exam normal        Cardiovascular hypertension, Normal cardiovascular exam+ Valvular Problems/Murmurs      Neuro/Psych  PSYCHIATRIC DISORDERS Anxiety Depression    negative neurological ROS     GI/Hepatic Neg liver ROS,GERD  ,,  Endo/Other  Hypothyroidism  Class 3 obesity  Renal/GU      Musculoskeletal   Abdominal   Peds  Hematology negative hematology ROS (+)   Anesthesia Other Findings Past Medical History: No date: Anxiety No date: Depression No date: Endometrial polyp No date: Family history of adverse reaction to anesthesia     Comment:  mother woke up after bladder procedure with splotches               and red face No date: Heart murmur No date: Hypercholesterolemia No date: Hypertension No date: Hypothyroidism No date: Laryngopharyngeal reflux (LPR) No date: Mitral valve disorder No date: Morbid obesity (HCC) No date: PMB (postmenopausal bleeding) No date: Thickened endometrium No date: Thyroid disease No date: Uterine leiomyoma  Past Surgical History: 2002,2009, 2014; 07/2017: COLONOSCOPY     Comment:  hx of colon cancer in multiple 1st degree relatives.               Findings, multiple small & large mouthed diverticula in               sigmoid colon 07/19/2017: COLONOSCOPY WITH PROPOFOL; N/A     Comment:  Procedure: COLONOSCOPY WITH PROPOFOL;  Surgeon: Earline Mayotte, MD;  Location: [facility];  Service:               Endoscopy;  Laterality: N/A; 2007: DILATION AND  CURETTAGE OF UTERUS     Comment:  endometrial polyps 10/11/2022: HYSTEROSCOPY WITH D & C; N/A     Comment:  Procedure: DILATATION AND CURETTAGE               /HYSTEROSCOPY/POLYPECTOMY;  Surgeon: Conard Novak,               MD;  Location: [facility];  Service: Gynecology;                Laterality: N/A; No date: WISDOM TOOTH EXTRACTION     Reproductive/Obstetrics negative OB ROS                             Anesthesia Physical Anesthesia Plan  ASA: 3  Anesthesia Plan: General ETT   Post-op Pain Management: Ofirmev IV (intra-op)*, Toradol IV (intra-op)* and Ketamine IV*   Induction: Intravenous  PONV Risk Score and Plan: 2 and Ondansetron, Dexamethasone, Midazolam and Treatment may vary due to age or medical condition  Airway Management Planned: Oral ETT  Additional Equipment:   Intra-op Plan:   Post-operative Plan: Extubation in OR  Informed Consent: I have reviewed the patients History and Physical, chart, labs and discussed the procedure including the risks, benefits and alternatives for the proposed anesthesia with the patient or  authorized representative who has indicated his/her understanding and acceptance.     Dental Advisory Given  Plan Discussed with: Anesthesiologist, CRNA and Surgeon  Anesthesia Plan Comments: (Patient consented for risks of anesthesia including but not limited to:  - adverse reactions to medications - damage to eyes, teeth, lips or other oral mucosa - nerve damage due to positioning  - sore throat or hoarseness - Damage to heart, brain, nerves, lungs, other parts of body or loss of life  Patient voiced understanding and assent.)        Anesthesia Quick Evaluation

## 2022-12-30 ENCOUNTER — Encounter: Payer: Self-pay | Admitting: Obstetrics and Gynecology

## 2023-01-03 LAB — SURGICAL PATHOLOGY

## 2023-01-05 NOTE — Anesthesia Postprocedure Evaluation (Signed)
Anesthesia Post Note  Patient: Sarah Sharp  Procedure(s) Performed: XI ROBOTIC ASSISTED TOTAL HYSTERECTOMY WITH BILATERAL SALPINGO OOPHORECTOMY (Bilateral) CYSTOSCOPY XI ROBOTIC ASSISTED LAPAROSCOPIC LYSIS OF ADHESION  Patient location during evaluation: PACU Anesthesia Type: General Level of consciousness: awake and alert Pain management: pain level controlled Vital Signs Assessment: post-procedure vital signs reviewed and stable Respiratory status: spontaneous breathing, nonlabored ventilation, respiratory function stable and patient connected to nasal cannula oxygen Cardiovascular status: blood pressure returned to baseline and stable Postop Assessment: no apparent nausea or vomiting Anesthetic complications: no   No notable events documented.   Last Vitals:  Vitals:   12/29/22 1428 12/29/22 1713  BP: 139/75 127/75  Pulse: 86 81  Resp: 16 16  Temp:    SpO2: 94% 100%    Last Pain:  Vitals:   12/30/22 0904  TempSrc:   PainSc: 0-No pain                 Lenard Simmer

## 2023-01-17 ENCOUNTER — Encounter: Payer: Self-pay | Admitting: Obstetrics and Gynecology

## 2023-09-25 ENCOUNTER — Encounter: Payer: Self-pay | Admitting: Gastroenterology

## 2023-10-07 ENCOUNTER — Encounter: Payer: Self-pay | Admitting: Emergency Medicine

## 2023-10-07 ENCOUNTER — Ambulatory Visit
Admission: EM | Admit: 2023-10-07 | Discharge: 2023-10-07 | Disposition: A | Discharge status: Home / Self Care | Attending: Family Medicine | Admitting: Family Medicine

## 2023-10-07 ENCOUNTER — Ambulatory Visit (INDEPENDENT_AMBULATORY_CARE_PROVIDER_SITE_OTHER)

## 2023-10-07 ENCOUNTER — Ambulatory Visit: Payer: Self-pay | Admitting: Physician Assistant

## 2023-10-07 DIAGNOSIS — M25561 Pain in right knee: Secondary | ICD-10-CM | POA: Diagnosis not present

## 2023-10-07 MED ORDER — MELOXICAM 15 MG PO TABS
15.0000 mg | ORAL_TABLET | Freq: Every day | ORAL | 0 refills | Status: AC
Start: 2023-10-07 — End: ?

## 2023-10-07 NOTE — ED Provider Notes (Signed)
 [facility]    CSN: [id number] Arrival date & time: 10/07/23  0805      History   Chief Complaint Chief Complaint  Patient presents with   Knee Pain    right    HPI Sarah Sharp is a 58 y.o. female.   Patient presents today with a several month history of intermittent right knee pain.  She reports over the past few weeks this has become much more painful and yesterday when she was going from a sitting to standing position she felt a shearing pain in her knee.  Since then she has had ongoing pain prompting evaluation.  She denies previous injury or surgery involving her knee.  She has never seen orthopedic provider had intra-articular injections.  She initially tried some ibuprofen  but this was ineffective so has not been taking any over-the-counter medicine.  She reports that at rest the pain is 0 but increases with going from a sitting to standing position or prolonged ambulation and increases to 7/8, described as sharp, no alleviating factors identified.  She denies any numbness or paresthesias in her foot.  She is having difficulty with her daily activities as a result of symptoms.    Past Medical History:  Diagnosis Date   Anxiety    Depression    Endometrial polyp    Family history of adverse reaction to anesthesia    mother woke up after bladder procedure with splotches and red face   Heart murmur    Hypercholesterolemia    Hypertension    Hypothyroidism    Laryngopharyngeal reflux (LPR)    Mitral valve disorder    Morbid obesity (HCC)    PMB (postmenopausal bleeding)    Thickened endometrium    Thyroid  disease    Uterine leiomyoma    Vitamin D deficiency     Patient Active Problem List   Diagnosis Date Noted   Postmenopausal bleeding 10/11/2022   Thickened endometrium 10/11/2022   Endometrial polyp 10/11/2022   Morbid obesity with BMI of 45.0-49.9, adult (HCC) 03/09/2020   Uterine leiomyoma 12/09/2017   History of dysfunctional uterine  bleeding 12/09/2017   Family history of ovarian cancer 12/05/2016   Essential hypertension 12/05/2016   Hypothyroidism 12/05/2016   Anxiety and depression 12/05/2016   Family history of colon cancer 05/29/2012    Past Surgical History:  Procedure Laterality Date   ABDOMINAL HYSTERECTOMY     COLONOSCOPY  2002,2009, 2014; 07/2017   hx of colon cancer in multiple 1st degree relatives. Findings, multiple small & large mouthed diverticula in sigmoid colon   COLONOSCOPY WITH PROPOFOL  N/A 07/19/2017   Procedure: COLONOSCOPY WITH PROPOFOL ;  Surgeon: Dessa Reyes ORN, MD;  Location: [facility];  Service: Endoscopy;  Laterality: N/A;   CYSTOSCOPY N/A 12/29/2022   Procedure: CYSTOSCOPY;  Surgeon: Leonce Garnette BIRCH, MD;  Location: [facility];  Service: Gynecology;  Laterality: N/A;   DILATION AND CURETTAGE OF UTERUS  2007   endometrial polyps   HYSTEROSCOPY WITH D & C N/A 10/11/2022   Procedure: DILATATION AND CURETTAGE /HYSTEROSCOPY/POLYPECTOMY;  Surgeon: Leonce Garnette BIRCH, MD;  Location: [facility];  Service: Gynecology;  Laterality: N/A;   ROBOTIC ASSISTED LAPAROSCOPIC LYSIS OF ADHESION  12/29/2022   Procedure: XI ROBOTIC ASSISTED LAPAROSCOPIC LYSIS OF ADHESION;  Surgeon: Leonce Garnette BIRCH, MD;  Location: [facility];  Service: Gynecology;;   ROBOTIC ASSISTED TOTAL HYSTERECTOMY WITH BILATERAL SALPINGO OOPHERECTOMY Bilateral 12/29/2022   Procedure: XI ROBOTIC ASSISTED TOTAL HYSTERECTOMY WITH BILATERAL SALPINGO OOPHORECTOMY;  Surgeon: Leonce Garnette  D, MD;  Location: [facility];  Service: Gynecology;  Laterality: Bilateral;   WISDOM TOOTH EXTRACTION      OB History     Gravida  0   Para      Term      Preterm      AB      Living         SAB      IAB      Ectopic      Multiple      Live Births           Obstetric Comments  Age first period 63          Home Medications    Prior to Admission medications   Medication Sig Start Date End Date Taking? Authorizing  Provider  meloxicam  (MOBIC ) 15 MG tablet Take 1 tablet (15 mg total) by mouth daily. 10/07/23  Yes Caydence Koenig K, PA-C  amLODipine (NORVASC) 10 MG tablet Take 10 mg by mouth at bedtime. 03/01/17   [provider]  buPROPion (WELLBUTRIN XL) 150 MG 24 hr tablet Take 150 mg by mouth every morning. 03/10/21   [provider]  Cholecalciferol 50 MCG (2000 UT) TABS Take 2,000 Units by mouth in the morning.    [provider]  levothyroxine (SYNTHROID, LEVOTHROID) 125 MCG tablet TAKE 1 TABLET BY MOUTH ONCE DAILY ON AN EMPTY STOMACH  WITH A GLASS OF WATER 30-60 MINUTES BEFORE BREAKFAST 10/28/16   [provider]  lisinopril-hydrochlorothiazide (PRINZIDE,ZESTORETIC) 10-12.5 MG tablet Take 1 tablet by mouth in the morning. 11/03/16 12/12/24  [provider]  Multiple Vitamins-Minerals (MULTIVITAMIN ADULT PO) Take 1 tablet by mouth in the morning.    [provider]  nortriptyline (PAMELOR) 10 MG capsule Take 20 mg by mouth at bedtime. 11/23/16   [provider]  omeprazole (PRILOSEC) 40 MG capsule Take 40 mg by mouth daily as needed (acid reflux/indigestion.). 05/27/22   [provider]  ondansetron  (ZOFRAN -ODT) 4 MG disintegrating tablet Take 1 tablet (4 mg total) by mouth every 6 (six) hours as needed for nausea. 12/29/22   Leonce Garnette BIRCH, MD    Family History Family History  Problem Relation Age of Onset   Hypertension Mother    Depression Mother    Cancer Father 76       colon cancer   Colon polyps Sister    Anxiety disorder Sister    Bipolar disorder Sister    Bladder Cancer Maternal Grandmother 80   Ovarian cancer Maternal Grandmother        vs uterine cancer had hysterectomy   Hypertension Maternal Grandfather    Cancer Paternal Grandmother        colon   Thyroid  disease Paternal Grandmother    Colon cancer Other    Breast cancer Neg Hx     Social History Social History   Tobacco Use   Smoking status: Never    Smokeless tobacco: Never  Vaping Use   Vaping status: Never Used  Substance Use Topics   Alcohol use: Not Currently    Comment: rarely   Drug use: No     Allergies   Patient has no known allergies.   Review of Systems Review of Systems  Constitutional:  Positive for activity change. Negative for appetite change, fatigue and fever.  Gastrointestinal:  Negative for nausea and vomiting.  Musculoskeletal:  Positive for arthralgias and gait problem. Negative for joint swelling and myalgias.  Skin:  Negative for color  change and wound.  Neurological:  Negative for weakness and numbness.     Physical Exam Triage Vital Signs ED Triage Vitals  Encounter Vitals Group     BP 10/07/23 0816 119/87     Girls Systolic BP Percentile --      Girls Diastolic BP Percentile --      Boys Systolic BP Percentile --      Boys Diastolic BP Percentile --      Pulse Rate 10/07/23 0816 95     Resp 10/07/23 0816 15     Temp 10/07/23 0816 98.8 F (37.1 C)     Temp Source 10/07/23 0816 Oral     SpO2 10/07/23 0816 99 %     Weight 10/07/23 0813 274 lb 7.6 oz (124.5 kg)     Height 10/07/23 0813 5' 5 (1.651 m)     Head Circumference --      Peak Flow --      Pain Score 10/07/23 0813 3     Pain Loc --      Pain Education --      Exclude from Growth Chart --    No data found.  Updated Vital Signs BP 119/87 (BP Location: Right Arm)   Pulse 95   Temp 98.8 F (37.1 C) (Oral)   Resp 15   Ht 5' 5 (1.651 m)   Wt 274 lb 7.6 oz (124.5 kg)   LMP 02/07/2021 (Exact Date)   SpO2 99%   BMI 45.67 kg/m   Visual Acuity Right Eye Distance:   Left Eye Distance:   Bilateral Distance:    Right Eye Near:   Left Eye Near:    Bilateral Near:     Physical Exam Vitals reviewed.  Constitutional:      General: She is awake. She is not in acute distress.    Appearance: Normal appearance. She is well-developed. She is not ill-appearing.     Comments: Very pleasant female appears stated age in no acute  distress sitting comfortably in exam room  HENT:     Head: Normocephalic and atraumatic.  Cardiovascular:     Rate and Rhythm: Normal rate and regular rhythm.     Heart sounds: S1 normal and S2 normal. Murmur heard.  Pulmonary:     Effort: Pulmonary effort is normal.     Breath sounds: Normal breath sounds. No wheezing, rhonchi or rales.     Comments: Clear to auscultation bilaterally Musculoskeletal:     Right knee: No swelling. Decreased range of motion. Tenderness present over the lateral joint line. No LCL laxity, MCL laxity, ACL laxity or PCL laxity.     Instability Tests: Lateral McMurray test positive. Medial McMurray test negative.     Left knee: No swelling. Normal range of motion. No tenderness.     Comments: Right knee: Tenderness palpation over inferior lateral tibial plateau.  No deformity noted.  Normal active range of motion but pain with flexion.  No ligamentous laxity on exam.  Positive lateral McMurray.    Psychiatric:        Behavior: Behavior is cooperative.      UC Treatments / Results  Labs (all labs ordered are listed, but only abnormal results are displayed) Labs Reviewed - No data to display  EKG   Radiology DG Knee Complete 4 Views Right Result Date: 10/07/2023 CLINICAL DATA:  Right knee pain for several weeks. EXAM: RIGHT KNEE - COMPLETE 4+ VIEW COMPARISON:  None Available. FINDINGS: No evidence of fracture, dislocation,  or joint effusion. Mild degenerative spurring is seen involving the lateral compartment and patella. No evidence of joint space narrowing or other significant osseous abnormality. IMPRESSION: No acute findings. Mild degenerative spurring of lateral compartment and patella. Electronically Signed   By: Norleen DELENA Kil M.D.   On: 10/07/2023 09:14    Procedures Procedures (including critical care time)  Medications Ordered in UC Medications - No data to display  Initial Impression / Assessment and Plan / UC Course  I have reviewed the  triage vital signs and the nursing notes.  Pertinent labs & imaging results that were available during my care of the patient were reviewed by me and considered in my medical decision making (see chart for details).     Patient is well-appearing, afebrile, nontoxic, nontachycardic.  X-ray was obtained that showed degenerative changes in the lateral component we discussed that this could be contributing to symptoms but I am also concerned about a meniscal injury given positive McMurray.  She was started on Mobic  to help with pain and inflammation and we discussed that she should not combine this with additional NSAIDs due to risk of GI bleeding.  No indication for dose adjustment based on metabolic panel from 12/22/2022 with a creatinine of 0.90 and calculated creatinine clearance of 137 mL/min.  She can use Tylenol  for breakthrough pain.  She has a brace and was encouraged to use this as well as avoid strenuous activity.  We discussed that if her symptoms are improving quickly with conservative treatment measures she should follow-up with orthopedics for further evaluation and management and was given the contact information for local provider with instruction to call to schedule appointment.  We discussed that if she has any worsening or changing symptoms that she needs to be seen immediately.  Strict return precautions given.  All questions answered to patient satisfaction.  Final Clinical Impressions(s) / UC Diagnoses   Final diagnoses:  Acute pain of right knee     Discharge Instructions      I do not see anything broken or out of place on your x-ray.  I will contact you if the radiologist sees something that I did not.  Continue using your brace for comfort and support.  Start meloxicam /Mobic  daily.  Do not take additional NSAIDs with this medication including aspirin, ibuprofen /Advil , naproxen/Aleve.  You can use Tylenol /acetaminophen  for breakthrough pain.  I do recommend that you follow-up  with orthopedics; call to schedule an appointment.  If anything worsens and you have increasing pain, redness, swelling, fever, nausea, vomiting, numbness or tingling in your foot you should be seen immediately.     ED Prescriptions     Medication Sig Dispense Auth. Provider   meloxicam  (MOBIC ) 15 MG tablet Take 1 tablet (15 mg total) by mouth daily. 30 tablet Glyndon Tursi K, PA-C      PDMP not reviewed this encounter.   Sherrell Rocky POUR, PA-C 10/07/23 [phone]

## 2023-10-07 NOTE — ED Triage Notes (Signed)
 Patient c/o right knee pain off and on for couple of weeks.  Patient states that when she was standing last night she felt a sharp pain in her right knee.  Patient denies swelling.

## 2023-10-07 NOTE — Discharge Instructions (Addendum)
 I do not see anything broken or out of place on your x-ray.  I will contact you if the radiologist sees something that I did not.  Continue using your brace for comfort and support.  Start meloxicam /Mobic  daily.  Do not take additional NSAIDs with this medication including aspirin, ibuprofen /Advil , naproxen/Aleve.  You can use Tylenol /acetaminophen  for breakthrough pain.  I do recommend that you follow-up with orthopedics; call to schedule an appointment.  If anything worsens and you have increasing pain, redness, swelling, fever, nausea, vomiting, numbness or tingling in your foot you should be seen immediately.

## 2023-10-12 NOTE — H&P (Signed)
 Pre-Procedure H&P   Patient ID: Sarah Sharp is a 58 y.o. female.  Gastroenterology Provider: Elspeth Ozell Jungling, DO  Referring Provider: Dr. Sherial PCP: Sherial Bail, MD  Date: 10/13/2023  HPI Sarah Sharp is a 58 y.o. female who presents today for Colonoscopy for Personal history of colon polyps .  Patient last underwent colonoscopy in July 2019 with 1 sessile serrated polyp and 1  tubular adenoma removed measuring 9 and 12 mm respectively. Additionally, family history of colon cancer in her father and paternal grandfather.  Polyps in her sister in her 30s.  Patient underwent hysterectomy   Reports daily bowel movement without melena or hematochezia  Hemoglobin 13.3 MCV 86 platelets 434,000 creatinine 0.9   Past Medical History:  Diagnosis Date   Anxiety    Depression    Endometrial polyp    Family history of adverse reaction to anesthesia    mother woke up after bladder procedure with splotches and red face   Heart murmur    Hypercholesterolemia    Hypertension    Hypothyroidism    Laryngopharyngeal reflux (LPR)    Mitral valve disorder    Morbid obesity (HCC)    PMB (postmenopausal bleeding)    Thickened endometrium    Thyroid  disease    Uterine leiomyoma    Vitamin D deficiency     Past Surgical History:  Procedure Laterality Date   ABDOMINAL HYSTERECTOMY     COLONOSCOPY  2002,2009, 2014; 07/2017   hx of colon cancer in multiple 1st degree relatives. Findings, multiple small & large mouthed diverticula in sigmoid colon   COLONOSCOPY WITH PROPOFOL  N/A 07/19/2017   Procedure: COLONOSCOPY WITH PROPOFOL ;  Surgeon: Dessa Reyes ORN, MD;  Location: [facility];  Service: Endoscopy;  Laterality: N/A;   CYSTOSCOPY N/A 12/29/2022   Procedure: CYSTOSCOPY;  Surgeon: Leonce Garnette BIRCH, MD;  Location: [facility];  Service: Gynecology;  Laterality: N/A;   DILATION AND CURETTAGE OF UTERUS  2007   endometrial polyps   HYSTEROSCOPY WITH D & C  N/A 10/11/2022   Procedure: DILATATION AND CURETTAGE /HYSTEROSCOPY/POLYPECTOMY;  Surgeon: Leonce Garnette BIRCH, MD;  Location: [facility] ORS;  Service: Gynecology;  Laterality: N/A;   ROBOTIC ASSISTED LAPAROSCOPIC LYSIS OF ADHESION  12/29/2022   Procedure: XI ROBOTIC ASSISTED LAPAROSCOPIC LYSIS OF ADHESION;  Surgeon: Leonce Garnette BIRCH, MD;  Location: [facility] ORS;  Service: Gynecology;;   ROBOTIC ASSISTED TOTAL HYSTERECTOMY WITH BILATERAL SALPINGO OOPHERECTOMY Bilateral 12/29/2022   Procedure: XI ROBOTIC ASSISTED TOTAL HYSTERECTOMY WITH BILATERAL SALPINGO OOPHORECTOMY;  Surgeon: Leonce Garnette BIRCH, MD;  Location: [facility] OR[facility];  Service: Gynecology;  Laterality: Bilateral;   WISDOM TOOTH EXTRACTION      Family History Father and paternal grandmother with colorectal cancer.  History of polyps in her 30s No other h/o GI disease or malignancy  Review of Systems  Constitutional:  Negative for activity change, appetite change, chills, diaphoresis, fatigue, fever and unexpected weight change.  HENT:  Negative for trouble swallowing and voice change.   Respiratory:  Negative for shortness of breath and wheezing.   Cardiovascular:  Negative for chest pain, palpitations and leg swelling.  Gastrointestinal:  Negative for abdominal distention, abdominal pain, anal bleeding, blood in stool, constipation, diarrhea, nausea, rectal pain and vomiting.  Musculoskeletal:  Negative for arthralgias and myalgias.  Skin:  Negative for color change and pallor.  Neurological:  Negative for dizziness, syncope and weakness.  Psychiatric/Behavioral:  Negative for confusion.   All other systems reviewed and are negative.  Medications No current facility-administered medications on file prior to encounter.   Current Outpatient Medications on File Prior to Encounter  Medication Sig Dispense Refill   amLODipine (NORVASC) 10 MG tablet Take 10 mg by mouth at bedtime.     buPROPion (WELLBUTRIN XL) 150 MG 24 hr tablet Take 150 mg  by mouth every morning.     Cholecalciferol 50 MCG (2000 UT) TABS Take 2,000 Units by mouth in the morning.     levothyroxine (SYNTHROID, LEVOTHROID) 125 MCG tablet TAKE 1 TABLET BY MOUTH ONCE DAILY ON AN EMPTY STOMACH  WITH A GLASS OF WATER 30-60 MINUTES BEFORE BREAKFAST     lisinopril-hydrochlorothiazide (PRINZIDE,ZESTORETIC) 10-12.5 MG tablet Take 1 tablet by mouth in the morning.     Multiple Vitamins-Minerals (MULTIVITAMIN ADULT PO) Take 1 tablet by mouth in the morning.     nortriptyline (PAMELOR) 10 MG capsule Take 20 mg by mouth at bedtime.     omeprazole (PRILOSEC) 40 MG capsule Take 40 mg by mouth daily as needed (acid reflux/indigestion.).     ondansetron  (ZOFRAN -ODT) 4 MG disintegrating tablet Take 1 tablet (4 mg total) by mouth every 6 (six) hours as needed for nausea. 20 tablet 0    Pertinent medications related to GI and procedure were reviewed by me with the patient prior to the procedure   Current Facility-Administered Medications:    0.9 %  sodium chloride  infusion, , Intravenous, Continuous, Onita Elspeth Sharper, DO, Last Rate: 20 mL/hr at 10/13/23 0757, 20 mL/hr at 10/13/23 0757  sodium chloride  20 mL/hr (10/13/23 0757)       No Known Allergies Allergies were reviewed by me prior to the procedure  Objective   Body mass index is 44.2 kg/m. Vitals:   10/13/23 0748  BP: 129/80  Pulse: 93  Resp: 20  Temp: (!) 96.7 F (35.9 C)  TempSrc: Temporal  SpO2: 99%  Weight: 120.5 kg  Height: 5' 5 (1.651 m)     Physical Exam Vitals and nursing note reviewed.  Constitutional:      General: She is not in acute distress.    Appearance: Normal appearance. She is obese. She is not ill-appearing, toxic-appearing or diaphoretic.  HENT:     Head: Normocephalic and atraumatic.     Nose: Nose normal.     Mouth/Throat:     Mouth: Mucous membranes are moist.     Pharynx: Oropharynx is clear.  Eyes:     General: No scleral icterus.    Extraocular Movements: Extraocular  movements intact.  Cardiovascular:     Rate and Rhythm: Normal rate and regular rhythm.     Heart sounds: Normal heart sounds. No murmur heard.    No friction rub. No gallop.  Pulmonary:     Effort: Pulmonary effort is normal. No respiratory distress.     Breath sounds: Normal breath sounds. No wheezing, rhonchi or rales.  Abdominal:     General: Bowel sounds are normal. There is no distension.     Palpations: Abdomen is soft.     Tenderness: There is no abdominal tenderness. There is no guarding or rebound.  Musculoskeletal:     Cervical back: Neck supple.     Right lower leg: No edema.     Left lower leg: No edema.  Skin:    General: Skin is warm and dry.     Coloration: Skin is not jaundiced or pale.  Neurological:     General: No focal deficit present.     Mental Status: She  is alert and oriented to person, place, and time. Mental status is at baseline.  Psychiatric:        Mood and Affect: Mood normal.        Behavior: Behavior normal.        Thought Content: Thought content normal.        Judgment: Judgment normal.      Assessment:  Sarah Sharp is a 58 y.o. female  who presents today for Colonoscopy for Personal history of colon polyps .  Plan:  Colonoscopy with possible intervention today  Colonoscopy with possible biopsy, control of bleeding, polypectomy, and interventions as necessary has been discussed with the patient/patient representative. Informed consent was obtained from the patient/patient representative after explaining the indication, nature, and risks of the procedure including but not limited to death, bleeding, perforation, missed neoplasm/lesions, cardiorespiratory compromise, and reaction to medications. Opportunity for questions was given and appropriate answers were provided. Patient/patient representative has verbalized understanding is amenable to undergoing the procedure.   Elspeth Ozell Jungling, DO  [facility]  Gastroenterology  Portions of the record may have been created with voice recognition software. Occasional wrong-word or 'sound-a-like' substitutions may have occurred due to the inherent limitations of voice recognition software.  Read the chart carefully and recognize, using context, where substitutions may have occurred.

## 2023-10-13 ENCOUNTER — Ambulatory Visit: Admitting: Certified Registered Nurse Anesthetist

## 2023-10-13 ENCOUNTER — Encounter: Payer: Self-pay | Admitting: Gastroenterology

## 2023-10-13 ENCOUNTER — Ambulatory Visit
Admission: RE | Admit: 2023-10-13 | Discharge: 2023-10-13 | Disposition: A | Discharge status: Home / Self Care | Attending: Gastroenterology | Admitting: Gastroenterology

## 2023-10-13 ENCOUNTER — Encounter
Admission: RE | Disposition: A | Discharge status: Home / Self Care | Payer: Self-pay | Source: Home / Self Care | Attending: Gastroenterology

## 2023-10-13 DIAGNOSIS — Z90710 Acquired absence of both cervix and uterus: Secondary | ICD-10-CM | POA: Diagnosis not present

## 2023-10-13 DIAGNOSIS — E66813 Obesity, class 3: Secondary | ICD-10-CM | POA: Diagnosis not present

## 2023-10-13 DIAGNOSIS — I10 Essential (primary) hypertension: Secondary | ICD-10-CM | POA: Insufficient documentation

## 2023-10-13 DIAGNOSIS — K635 Polyp of colon: Secondary | ICD-10-CM | POA: Diagnosis not present

## 2023-10-13 DIAGNOSIS — K5730 Diverticulosis of large intestine without perforation or abscess without bleeding: Secondary | ICD-10-CM | POA: Diagnosis not present

## 2023-10-13 DIAGNOSIS — Z800 Family history of malignant neoplasm of digestive organs: Secondary | ICD-10-CM | POA: Diagnosis not present

## 2023-10-13 DIAGNOSIS — Z1211 Encounter for screening for malignant neoplasm of colon: Secondary | ICD-10-CM | POA: Diagnosis present

## 2023-10-13 DIAGNOSIS — K648 Other hemorrhoids: Secondary | ICD-10-CM | POA: Insufficient documentation

## 2023-10-13 DIAGNOSIS — Z6841 Body Mass Index (BMI) 40.0 and over, adult: Secondary | ICD-10-CM | POA: Insufficient documentation

## 2023-10-13 DIAGNOSIS — K644 Residual hemorrhoidal skin tags: Secondary | ICD-10-CM | POA: Diagnosis not present

## 2023-10-13 HISTORY — DX: Vitamin D deficiency, unspecified: E55.9

## 2023-10-13 HISTORY — PX: COLONOSCOPY: SHX5424

## 2023-10-13 HISTORY — PX: POLYPECTOMY: SHX149

## 2023-10-13 SURGERY — COLONOSCOPY
Anesthesia: General

## 2023-10-13 MED ORDER — SODIUM CHLORIDE 0.9 % IV SOLN
INTRAVENOUS | Status: DC
  Administered 2023-10-13: 20 mL/h via INTRAVENOUS

## 2023-10-13 MED ORDER — PROPOFOL 10 MG/ML IV BOLUS
INTRAVENOUS | Status: DC | PRN
  Administered 2023-10-13: 80 mg via INTRAVENOUS

## 2023-10-13 MED ORDER — PROPOFOL 500 MG/50ML IV EMUL
INTRAVENOUS | Status: DC | PRN
  Administered 2023-10-13: 160 ug/kg/min via INTRAVENOUS

## 2023-10-13 NOTE — Anesthesia Procedure Notes (Signed)
 Date/Time: 10/13/2023 8:31 AM  Performed by: Duwayne Craven, CRNAPre-anesthesia Checklist: Patient identified, Emergency Drugs available, Suction available, Patient being monitored and Timeout performed Patient Re-evaluated:Patient Re-evaluated prior to induction Oxygen Delivery Method: Nasal cannula Induction Type: IV induction Placement Confirmation: CO2 detector and positive ETCO2

## 2023-10-13 NOTE — Anesthesia Preprocedure Evaluation (Signed)
 Anesthesia Evaluation  Patient identified by MRN, date of birth, ID band Patient awake    Reviewed: Allergy & Precautions, NPO status , Patient's Chart, lab work & pertinent test results  Airway Mallampati: II  TM Distance: >3 FB Neck ROM: full    Dental  (+) Teeth Intact   Pulmonary neg pulmonary ROS   Pulmonary exam normal breath sounds clear to auscultation       Cardiovascular Exercise Tolerance: Good hypertension, Pt. on medications negative cardio ROS Normal cardiovascular exam Rhythm:Regular Rate:Normal     Neuro/Psych   Anxiety     negative neurological ROS  negative psych ROS   GI/Hepatic negative GI ROS, Neg liver ROS,,,  Endo/Other  negative endocrine ROSHypothyroidism  Class 3 obesity  Renal/GU negative Renal ROS  negative genitourinary   Musculoskeletal   Abdominal  (+) + obese  Peds negative pediatric ROS (+)  Hematology negative hematology ROS (+)   Anesthesia Other Findings Past Medical History: No date: Anxiety No date: Depression No date: Endometrial polyp No date: Family history of adverse reaction to anesthesia     Comment:  mother woke up after bladder procedure with splotches               and red face No date: Heart murmur No date: Hypercholesterolemia No date: Hypertension No date: Hypothyroidism No date: Laryngopharyngeal reflux (LPR) No date: Mitral valve disorder No date: Morbid obesity (HCC) No date: PMB (postmenopausal bleeding) No date: Thickened endometrium No date: Thyroid  disease No date: Uterine leiomyoma No date: Vitamin D deficiency  Past Surgical History: No date: ABDOMINAL HYSTERECTOMY 2002,2009, 2014; 07/2017: COLONOSCOPY     Comment:  hx of colon cancer in multiple 1st degree relatives.               Findings, multiple small & large mouthed diverticula in               sigmoid colon 07/19/2017: COLONOSCOPY WITH PROPOFOL ; N/A     Comment:  Procedure:  COLONOSCOPY WITH PROPOFOL ;  Surgeon: Dessa Reyes ORN, MD;  Location: [facility];  Service:               Endoscopy;  Laterality: N/A; 12/29/2022: CYSTOSCOPY; N/A     Comment:  Procedure: CYSTOSCOPY;  Surgeon: Leonce Garnette BIRCH, MD;              Location: [facility] ORS;  Service: Gynecology;  Laterality:               N/A; 2007: DILATION AND CURETTAGE OF UTERUS     Comment:  endometrial polyps 10/11/2022: HYSTEROSCOPY WITH D & C; N/A     Comment:  Procedure: DILATATION AND CURETTAGE               /HYSTEROSCOPY/POLYPECTOMY;  Surgeon: Leonce Garnette BIRCH,               MD;  Location: [facility] ORS;  Service: Gynecology;                Laterality: N/A; 12/29/2022: ROBOTIC ASSISTED LAPAROSCOPIC LYSIS OF ADHESION     Comment:  Procedure: XI ROBOTIC ASSISTED LAPAROSCOPIC LYSIS OF               ADHESION;  Surgeon: Leonce Garnette BIRCH, MD;  Location:               [facility] OR[facility];  Service: Gynecology;; 12/29/2022: ROBOTIC ASSISTED TOTAL HYSTERECTOMY  WITH BILATERAL  SALPINGO OOPHERECTOMY; Bilateral     Comment:  Procedure: XI ROBOTIC ASSISTED TOTAL HYSTERECTOMY WITH               BILATERAL SALPINGO OOPHORECTOMY;  Surgeon: Leonce Garnette BIRCH, MD;  Location: [facility] OR[facility];  Service: Gynecology;              Laterality: Bilateral; No date: WISDOM TOOTH EXTRACTION  BMI    Body Mass Index: 44.20 kg/m      Reproductive/Obstetrics negative OB ROS                              Anesthesia Physical Anesthesia Plan  ASA: 3  Anesthesia Plan: General   Post-op Pain Management:    Induction: Intravenous  PONV Risk Score and Plan: Propofol  infusion and TIVA  Airway Management Planned: Natural Airway and Nasal Cannula  Additional Equipment:   Intra-op Plan:   Post-operative Plan:   Informed Consent: I have reviewed the patients History and Physical, chart, labs and discussed the procedure including the risks, benefits and alternatives for the proposed  anesthesia with the patient or authorized representative who has indicated his/her understanding and acceptance.     Dental Advisory Given  Plan Discussed with: CRNA  Anesthesia Plan Comments:         Anesthesia Quick Evaluation

## 2023-10-13 NOTE — Op Note (Signed)
 [facility] Gastroenterology Patient Name: Sarah Sharp Procedure Date: 10/13/2023 8:13 AM MRN: [id number] Account #: [id number] Date of Birth: 22-May-1965 Admit Type: Outpatient Age: 58 Room: [facility] ENDO ROOM 1 Gender: Female Note Status: Finalized Instrument Name: Colon Scope [id number] Procedure:             Colonoscopy Indications:           High risk colon cancer surveillance: Personal history                         of colonic polyps, Family history of colon cancer. Providers:             Elspeth Ozell Onita ROSALEA, DO Referring MD:          Lavenia Beaver, MD (Referring MD) Medicines:             Monitored Anesthesia Care Complications:         No immediate complications. Estimated blood loss:                         Minimal. Procedure:             Pre-Anesthesia Assessment:                        - Prior to the procedure, a History and Physical was                         performed, and patient medications and allergies were                         reviewed. The patient is competent. The risks and                         benefits of the procedure and the sedation options and                         risks were discussed with the patient. All questions                         were answered and informed consent was obtained.                         Patient identification and proposed procedure were                         verified by the physician, the nurse, the anesthetist                         and the technician in the endoscopy suite. Mental                         Status Examination: alert and oriented. Airway                         Examination: normal oropharyngeal airway and neck                         mobility. Respiratory Examination: clear to  auscultation. CV Examination: RRR, no murmurs, no S3                         or S4. Prophylactic Antibiotics: The patient does not                         require prophylactic  antibiotics. Prior                         Anticoagulants: The patient has taken no anticoagulant                         or antiplatelet agents. ASA Grade Assessment: III - A                         patient with severe systemic disease. After reviewing                         the risks and benefits, the patient was deemed in                         satisfactory condition to undergo the procedure. The                         anesthesia plan was to use monitored anesthesia care                         (MAC). Immediately prior to administration of                         medications, the patient was re-assessed for adequacy                         to receive sedatives. The heart rate, respiratory                         rate, oxygen saturations, blood pressure, adequacy of                         pulmonary ventilation, and response to care were                         monitored throughout the procedure. The physical                         status of the patient was re-assessed after the                         procedure.                        After obtaining informed consent, the colonoscope was                         passed under direct vision. Throughout the procedure,                         the patient's blood pressure, pulse, and oxygen  saturations were monitored continuously. The                         Colonoscope was introduced through the anus and                         advanced to the the cecum, identified by appendiceal                         orifice and ileocecal valve. The colonoscopy was                         somewhat difficult due to a redundant colon.                         Successful completion of the procedure was aided by                         applying abdominal pressure and lavage. The patient                         tolerated the procedure well. The quality of the bowel                         preparation was evaluated using the BBPS  [facility] Bowel                         Preparation Scale) with scores of: Right Colon = 3,                         Transverse Colon = 3 and Left Colon = 3 (entire mucosa                         seen well with no residual staining, small fragments                         of stool or opaque liquid). The total BBPS score                         equals 9. The ileocecal valve, appendiceal orifice,                         and rectum were photographed. Findings:      Hemorrhoids were found on perianal exam.      The digital rectal exam was normal. Pertinent negatives include normal       sphincter tone.      Multiple small-mouthed diverticula were found in the left colon.       Estimated blood loss: none.      Non-bleeding external and internal hemorrhoids were found during       retroflexion and during perianal exam.      Two sessile polyps were found in the rectum and cecum. The polyps were 1       to 2 mm in size. These polyps were removed with a jumbo cold forceps.       Resection and retrieval were complete. Estimated blood loss was minimal.      Two sessile polyps were found in  the ascending colon. The polyps were 3       to 6 mm in size. These polyps were removed with a cold snare. Resection       and retrieval were complete. Estimated blood loss was minimal.      The exam was otherwise without abnormality on direct and retroflexion       views. Impression:            - Hemorrhoids found on perianal exam.                        - Diverticulosis in the left colon.                        - Non-bleeding external and internal hemorrhoids.                        - Two 1 to 2 mm polyps in the rectum and in the cecum,                         removed with a jumbo cold forceps. Resected and                         retrieved.                        - Two 3 to 6 mm polyps in the ascending colon, removed                         with a cold snare. Resected and retrieved.                        - The  examination was otherwise normal on direct and                         retroflexion views. Recommendation:        - Patient has a contact number available for                         emergencies. The signs and symptoms of potential                         delayed complications were discussed with the patient.                         Return to normal activities tomorrow. Written                         discharge instructions were provided to the patient.                        - Discharge patient to home.                        - Resume previous diet.                        - Continue present medications.                        -  No ibuprofen , naproxen, or other non-steroidal                         anti-inflammatory drugs for 5 days after polyp removal.                        - Await pathology results.                        - Repeat colonoscopy for surveillance based on                         pathology results.                        - Return to referring physician as previously                         scheduled.                        - The findings and recommendations were discussed with                         the patient. Procedure Code(s):     --- Professional ---                        [phone], Colonoscopy, flexible; with removal of                         tumor(s), polyp(s), or other lesion(s) by snare                         technique                        45380, 59, Colonoscopy, flexible; with biopsy, single                         or multiple Diagnosis Code(s):     --- Professional ---                        Z86.010, Personal history of colonic polyps                        K64.8, Other hemorrhoids                        D12.8, Benign neoplasm of rectum                        D12.0, Benign neoplasm of cecum                        D12.2, Benign neoplasm of ascending colon                        K57.30, Diverticulosis of large intestine without                          perforation or abscess without bleeding CPT copyright 2022 American Medical Association. All rights reserved.  The codes documented in this report are preliminary and upon coder review may  be revised to meet current compliance requirements. Attending Participation:      I personally performed the entire procedure. Elspeth Jungling, DO Elspeth Ozell Jungling DO, DO 10/13/2023 9:04:30 AM This report has been signed electronically. Number of Addenda: 0 Note Initiated On: 10/13/2023 8:13 AM Scope Withdrawal Time: 0 hours 16 minutes 3 seconds  Total Procedure Duration: 0 hours 24 minutes 27 seconds  Estimated Blood Loss:  Estimated blood loss was minimal.      [facility]

## 2023-10-13 NOTE — OR Nursing (Signed)
 RN SPOKE TO EMILY RAGAN AT [facility] RE: PT'S SPECIMENS- 3 PBOTTLES TO BE PLACED INTO MAIN LAB [facility] IN 1ST BLUE BIN

## 2023-10-13 NOTE — Anesthesia Postprocedure Evaluation (Signed)
 Anesthesia Post Note  Patient: Sarah Sharp  Procedure(s) Performed: COLONOSCOPY POLYPECTOMY, INTESTINE  Patient location during evaluation: PACU Anesthesia Type: General Level of consciousness: awake Pain management: satisfactory to patient Vital Signs Assessment: post-procedure vital signs reviewed and stable Respiratory status: spontaneous breathing Cardiovascular status: stable Anesthetic complications: no   No notable events documented.   Last Vitals:  Vitals:   10/13/23 0915 10/13/23 0924  BP: (!) 104/54 (!) 105/59  Pulse: 80 69  Resp: 13 11  Temp:    SpO2: 100% 100%    Last Pain:  Vitals:   10/13/23 0924  TempSrc:   PainSc: 0-No pain                 VAN STAVEREN,Rodney Wigger

## 2023-10-13 NOTE — Interval H&P Note (Signed)
 History and Physical Interval Note: Preprocedure H&P from 10/13/23  was reviewed and there was no interval change after seeing and examining the patient.  Written consent was obtained from the patient after discussion of risks, benefits, and alternatives. Patient has consented to proceed with Colonoscopy with possible intervention   10/13/2023 8:25 AM  Sarah Sharp  has presented today for surgery, with the diagnosis of History of colon polyps [Z86.0100].  The various methods of treatment have been discussed with the patient and family. After consideration of risks, benefits and other options for treatment, the patient has consented to  Procedure(s): COLONOSCOPY (N/A) as a surgical intervention.  The patient's history has been reviewed, patient examined, no change in status, stable for surgery.  I have reviewed the patient's chart and labs.  Questions were answered to the patient's satisfaction.     Elspeth Ozell Jungling

## 2023-10-13 NOTE — Transfer of Care (Signed)
 Immediate Anesthesia Transfer of Care Note  Patient: Sarah Sharp  Procedure(s) Performed: COLONOSCOPY POLYPECTOMY, INTESTINE  Patient Location: PACU  Anesthesia Type:General  Level of Consciousness: drowsy  Airway & Oxygen Therapy: Patient Spontanous Breathing  Post-op Assessment: Report given to RN and Post -op Vital signs reviewed and stable  Post vital signs: Reviewed and stable  Last Vitals:  Vitals Value Taken Time  BP    Temp    Pulse    Resp    SpO2      Last Pain:  Vitals:   10/13/23 0748  TempSrc: Temporal  PainSc: 0-No pain         Complications: No notable events documented.

## 2023-10-19 ENCOUNTER — Other Ambulatory Visit: Payer: Self-pay | Admitting: Internal Medicine

## 2023-10-19 DIAGNOSIS — Z1231 Encounter for screening mammogram for malignant neoplasm of breast: Secondary | ICD-10-CM

## 2023-11-22 ENCOUNTER — Ambulatory Visit
Admission: RE | Admit: 2023-11-22 | Discharge: 2023-11-22 | Disposition: A | Discharge status: Home / Self Care | Source: Ambulatory Visit | Attending: Internal Medicine | Admitting: Internal Medicine

## 2023-11-22 DIAGNOSIS — Z1231 Encounter for screening mammogram for malignant neoplasm of breast: Secondary | ICD-10-CM | POA: Diagnosis present
